# Patient Record
Sex: Female | Born: 1945 | Race: Black or African American | Hispanic: No | Smoking: Never smoker
Health system: Southern US, Community
[De-identification: ages and names within clinical notes are randomized; demographics above are authoritative.]

## PROBLEM LIST (undated history)

## (undated) DIAGNOSIS — I10 Essential (primary) hypertension: Secondary | ICD-10-CM

---

## 2004-04-07 ENCOUNTER — Ambulatory Visit: Payer: Self-pay | Admitting: Obstetrics & Gynecology

## 2005-06-23 ENCOUNTER — Ambulatory Visit: Payer: Self-pay | Admitting: Obstetrics & Gynecology

## 2005-06-29 ENCOUNTER — Ambulatory Visit: Payer: Self-pay | Admitting: Gastroenterology

## 2006-08-11 ENCOUNTER — Ambulatory Visit: Payer: Self-pay

## 2007-08-10 ENCOUNTER — Ambulatory Visit: Payer: Self-pay

## 2008-05-11 ENCOUNTER — Emergency Department: Payer: Self-pay | Admitting: Emergency Medicine

## 2008-11-21 ENCOUNTER — Ambulatory Visit: Payer: Self-pay

## 2010-01-21 ENCOUNTER — Ambulatory Visit: Payer: Self-pay

## 2011-02-27 ENCOUNTER — Ambulatory Visit: Payer: Self-pay | Admitting: Family Medicine

## 2012-04-06 ENCOUNTER — Ambulatory Visit: Payer: Self-pay | Admitting: Family Medicine

## 2013-04-11 ENCOUNTER — Ambulatory Visit: Payer: Self-pay | Admitting: Family Medicine

## 2014-04-13 ENCOUNTER — Ambulatory Visit: Admit: 2014-04-13 | Disposition: A | Discharge status: Home / Self Care | Payer: Self-pay | Admitting: Adult Health

## 2015-04-16 ENCOUNTER — Other Ambulatory Visit: Payer: Self-pay | Admitting: Family Medicine

## 2015-04-16 DIAGNOSIS — Z1231 Encounter for screening mammogram for malignant neoplasm of breast: Secondary | ICD-10-CM

## 2015-04-25 ENCOUNTER — Ambulatory Visit
Admission: RE | Admit: 2015-04-25 | Discharge: 2015-04-25 | Disposition: A | Discharge status: Home / Self Care | Payer: Medicare Other | Source: Ambulatory Visit | Attending: Family Medicine | Admitting: Family Medicine

## 2015-04-25 ENCOUNTER — Other Ambulatory Visit: Payer: Self-pay | Admitting: Family Medicine

## 2015-04-25 DIAGNOSIS — Z1231 Encounter for screening mammogram for malignant neoplasm of breast: Secondary | ICD-10-CM

## 2016-04-20 ENCOUNTER — Other Ambulatory Visit: Payer: Self-pay | Admitting: Family Medicine

## 2016-04-20 DIAGNOSIS — Z1231 Encounter for screening mammogram for malignant neoplasm of breast: Secondary | ICD-10-CM

## 2016-05-06 ENCOUNTER — Ambulatory Visit
Admission: RE | Admit: 2016-05-06 | Discharge: 2016-05-06 | Disposition: A | Discharge status: Home / Self Care | Payer: Medicare Other | Source: Ambulatory Visit | Attending: Family Medicine | Admitting: Family Medicine

## 2016-05-06 DIAGNOSIS — Z1231 Encounter for screening mammogram for malignant neoplasm of breast: Secondary | ICD-10-CM | POA: Diagnosis not present

## 2017-04-09 ENCOUNTER — Other Ambulatory Visit: Payer: Self-pay | Admitting: Family Medicine

## 2017-04-09 DIAGNOSIS — Z1231 Encounter for screening mammogram for malignant neoplasm of breast: Secondary | ICD-10-CM

## 2017-05-07 ENCOUNTER — Ambulatory Visit
Admission: RE | Admit: 2017-05-07 | Discharge: 2017-05-07 | Disposition: A | Discharge status: Home / Self Care | Payer: Medicare Other | Source: Ambulatory Visit | Attending: Family Medicine | Admitting: Family Medicine

## 2017-05-07 DIAGNOSIS — Z1231 Encounter for screening mammogram for malignant neoplasm of breast: Secondary | ICD-10-CM | POA: Diagnosis not present

## 2018-04-08 ENCOUNTER — Other Ambulatory Visit: Payer: Self-pay | Admitting: Family Medicine

## 2018-04-08 DIAGNOSIS — Z1231 Encounter for screening mammogram for malignant neoplasm of breast: Secondary | ICD-10-CM

## 2018-06-30 ENCOUNTER — Ambulatory Visit
Admission: RE | Admit: 2018-06-30 | Discharge: 2018-06-30 | Disposition: A | Discharge status: Home / Self Care | Payer: Medicare Other | Source: Ambulatory Visit | Attending: Family Medicine | Admitting: Family Medicine

## 2018-06-30 ENCOUNTER — Other Ambulatory Visit: Payer: Self-pay

## 2018-06-30 DIAGNOSIS — Z1231 Encounter for screening mammogram for malignant neoplasm of breast: Secondary | ICD-10-CM | POA: Insufficient documentation

## 2018-07-05 ENCOUNTER — Other Ambulatory Visit: Payer: Self-pay | Admitting: Family Medicine

## 2018-07-05 DIAGNOSIS — R928 Other abnormal and inconclusive findings on diagnostic imaging of breast: Secondary | ICD-10-CM

## 2018-07-12 ENCOUNTER — Ambulatory Visit
Admission: RE | Admit: 2018-07-12 | Discharge: 2018-07-12 | Disposition: A | Discharge status: Home / Self Care | Payer: Medicare Other | Source: Ambulatory Visit | Attending: Family Medicine | Admitting: Family Medicine

## 2018-07-12 ENCOUNTER — Other Ambulatory Visit: Payer: Self-pay

## 2018-07-12 DIAGNOSIS — R928 Other abnormal and inconclusive findings on diagnostic imaging of breast: Secondary | ICD-10-CM

## 2019-03-17 ENCOUNTER — Ambulatory Visit: Payer: Medicare Other | Attending: Internal Medicine

## 2019-03-17 DIAGNOSIS — Z23 Encounter for immunization: Secondary | ICD-10-CM

## 2019-03-17 NOTE — Progress Notes (Signed)
   Covid-19 Vaccination Clinic  Name:  Stacie Stein    MRN: [id number] DOB: Feb 27, 1945  03/17/2019  [name] was observed post Covid-19 immunization for 15 minutes without incident. She was provided with Vaccine Information Sheet and instruction to access the V-Safe system.   [name] was instructed to call 911 with any severe reactions post vaccine: Marland Kitchen Difficulty breathing  . Swelling of face and throat  . A fast heartbeat  . A bad rash all over body  . Dizziness and weakness   Immunizations Administered    Name Date Dose VIS Date Route   Pfizer COVID-19 Vaccine 03/17/2019 10:01 AM 0.3 mL 12/16/2018 Intramuscular   Manufacturer: [place]   Lot: [id number]   [place]: [id number]

## 2019-04-11 ENCOUNTER — Ambulatory Visit: Payer: Medicare Other | Attending: Internal Medicine

## 2019-04-11 DIAGNOSIS — Z23 Encounter for immunization: Secondary | ICD-10-CM

## 2019-04-11 NOTE — Progress Notes (Signed)
   Covid-19 Vaccination Clinic  Name:  Stacie Stein    MRN: [id number] DOB: 1945-02-17  04/11/2019  [name] was observed post Covid-19 immunization for 15 minutes without incident. She was provided with Vaccine Information Sheet and instruction to access the V-Safe system.   [name] was instructed to call 911 with any severe reactions post vaccine: Marland Kitchen Difficulty breathing  . Swelling of face and throat  . A fast heartbeat  . A bad rash all over body  . Dizziness and weakness   Immunizations Administered    Name Date Dose VIS Date Route   Pfizer COVID-19 Vaccine 04/11/2019  9:34 AM 0.3 mL 12/16/2018 Intramuscular   Manufacturer: [place]   Lot: [id number]   [place]: [id number]

## 2019-05-05 ENCOUNTER — Other Ambulatory Visit: Payer: Self-pay | Admitting: Family Medicine

## 2019-05-05 DIAGNOSIS — Z1231 Encounter for screening mammogram for malignant neoplasm of breast: Secondary | ICD-10-CM

## 2019-07-03 ENCOUNTER — Ambulatory Visit
Admission: RE | Admit: 2019-07-03 | Discharge: 2019-07-03 | Disposition: A | Discharge status: Home / Self Care | Payer: Medicare Other | Source: Ambulatory Visit | Attending: Family Medicine | Admitting: Family Medicine

## 2019-07-03 DIAGNOSIS — Z1231 Encounter for screening mammogram for malignant neoplasm of breast: Secondary | ICD-10-CM | POA: Diagnosis present

## 2019-07-09 ENCOUNTER — Encounter (HOSPITAL_BASED_OUTPATIENT_CLINIC_OR_DEPARTMENT_OTHER): Payer: Self-pay

## 2019-07-09 ENCOUNTER — Inpatient Hospital Stay
Admission: AD | Admit: 2019-07-09 | Payer: Medicare Other | Source: Other Acute Inpatient Hospital | Admitting: Family Medicine

## 2019-07-09 ENCOUNTER — Emergency Department (HOSPITAL_BASED_OUTPATIENT_CLINIC_OR_DEPARTMENT_OTHER): Payer: Medicare Other

## 2019-07-09 ENCOUNTER — Other Ambulatory Visit: Payer: Self-pay

## 2019-07-09 ENCOUNTER — Inpatient Hospital Stay (HOSPITAL_BASED_OUTPATIENT_CLINIC_OR_DEPARTMENT_OTHER)
Admission: EM | Admit: 2019-07-09 | Discharge: 2019-07-14 | DRG: 418 | Disposition: A | Discharge status: Home Health | Payer: Medicare Other | Attending: Internal Medicine | Admitting: Internal Medicine

## 2019-07-09 DIAGNOSIS — N179 Acute kidney failure, unspecified: Secondary | ICD-10-CM | POA: Diagnosis present

## 2019-07-09 DIAGNOSIS — I10 Essential (primary) hypertension: Secondary | ICD-10-CM | POA: Diagnosis present

## 2019-07-09 DIAGNOSIS — K859 Acute pancreatitis without necrosis or infection, unspecified: Secondary | ICD-10-CM

## 2019-07-09 DIAGNOSIS — K851 Biliary acute pancreatitis without necrosis or infection: Secondary | ICD-10-CM | POA: Diagnosis present

## 2019-07-09 DIAGNOSIS — E860 Dehydration: Secondary | ICD-10-CM | POA: Diagnosis present

## 2019-07-09 DIAGNOSIS — I129 Hypertensive chronic kidney disease with stage 1 through stage 4 chronic kidney disease, or unspecified chronic kidney disease: Secondary | ICD-10-CM | POA: Diagnosis present

## 2019-07-09 DIAGNOSIS — E876 Hypokalemia: Secondary | ICD-10-CM | POA: Diagnosis present

## 2019-07-09 DIAGNOSIS — K8020 Calculus of gallbladder without cholecystitis without obstruction: Secondary | ICD-10-CM

## 2019-07-09 DIAGNOSIS — R0600 Dyspnea, unspecified: Secondary | ICD-10-CM

## 2019-07-09 DIAGNOSIS — K8021 Calculus of gallbladder without cholecystitis with obstruction: Secondary | ICD-10-CM

## 2019-07-09 DIAGNOSIS — N1831 Chronic kidney disease, stage 3a: Secondary | ICD-10-CM | POA: Diagnosis present

## 2019-07-09 DIAGNOSIS — Z20822 Contact with and (suspected) exposure to covid-19: Secondary | ICD-10-CM | POA: Diagnosis present

## 2019-07-09 DIAGNOSIS — K8011 Calculus of gallbladder with chronic cholecystitis with obstruction: Secondary | ICD-10-CM | POA: Diagnosis present

## 2019-07-09 HISTORY — DX: Essential (primary) hypertension: I10

## 2019-07-09 LAB — URINALYSIS, ROUTINE W REFLEX MICROSCOPIC
Bilirubin Urine: NEGATIVE
Glucose, UA: NEGATIVE mg/dL
Hgb urine dipstick: NEGATIVE
Ketones, ur: NEGATIVE mg/dL
Nitrite: NEGATIVE
Protein, ur: NEGATIVE mg/dL
Specific Gravity, Urine: 1.015 (ref 1.005–1.030)
pH: 6.5 (ref 5.0–8.0)

## 2019-07-09 LAB — COMPREHENSIVE METABOLIC PANEL
ALT: 360 U/L — ABNORMAL HIGH (ref 0–44)
AST: 494 U/L — ABNORMAL HIGH (ref 15–41)
Albumin: 4.1 g/dL (ref 3.5–5.0)
Alkaline Phosphatase: 92 U/L (ref 38–126)
Anion gap: 12 (ref 5–15)
BUN: 22 mg/dL (ref 8–23)
CO2: 25 mmol/L (ref 22–32)
Calcium: 8.9 mg/dL (ref 8.9–10.3)
Chloride: 97 mmol/L — ABNORMAL LOW (ref 98–111)
Creatinine, Ser: 1.45 mg/dL — ABNORMAL HIGH (ref 0.44–1.00)
GFR calc Af Amer: 41 mL/min — ABNORMAL LOW (ref >60)
GFR calc non Af Amer: 36 mL/min — ABNORMAL LOW (ref >60)
Glucose, Bld: 187 mg/dL — ABNORMAL HIGH (ref 70–99)
Potassium: 3.1 mmol/L — ABNORMAL LOW (ref 3.5–5.1)
Sodium: 134 mmol/L — ABNORMAL LOW (ref 135–145)
Total Bilirubin: 1.4 mg/dL — ABNORMAL HIGH (ref 0.3–1.2)
Total Protein: 7.6 g/dL (ref 6.5–8.1)

## 2019-07-09 LAB — URINALYSIS, MICROSCOPIC (REFLEX)

## 2019-07-09 LAB — CBC WITH DIFFERENTIAL/PLATELET
Abs Immature Granulocytes: 0.06 10*3/uL (ref 0.00–0.07)
Basophils Absolute: 0 10*3/uL (ref 0.0–0.1)
Basophils Relative: 0 %
Eosinophils Absolute: 0.1 10*3/uL (ref 0.0–0.5)
Eosinophils Relative: 0 %
HCT: 39.1 % (ref 36.0–46.0)
Hemoglobin: 13.1 g/dL (ref 12.0–15.0)
Immature Granulocytes: 0 %
Lymphocytes Relative: 19 %
Lymphs Abs: 2.8 10*3/uL (ref 0.7–4.0)
MCH: 30.8 pg (ref 26.0–34.0)
MCHC: 33.5 g/dL (ref 30.0–36.0)
MCV: 92 fL (ref 80.0–100.0)
Monocytes Absolute: 0.7 10*3/uL (ref 0.1–1.0)
Monocytes Relative: 5 %
Neutro Abs: 11.2 10*3/uL — ABNORMAL HIGH (ref 1.7–7.7)
Neutrophils Relative %: 76 %
Platelets: 262 10*3/uL (ref 150–400)
RBC: 4.25 MIL/uL (ref 3.87–5.11)
RDW: 12.3 % (ref 11.5–15.5)
WBC: 14.8 10*3/uL — ABNORMAL HIGH (ref 4.0–10.5)
nRBC: 0 % (ref 0.0–0.2)

## 2019-07-09 LAB — SARS CORONAVIRUS 2 BY RT PCR (HOSPITAL ORDER, PERFORMED IN ~~LOC~~ HOSPITAL LAB): SARS Coronavirus 2: NEGATIVE

## 2019-07-09 LAB — LIPASE, BLOOD: Lipase: 7182 U/L — ABNORMAL HIGH (ref 11–51)

## 2019-07-09 MED ORDER — SODIUM CHLORIDE 0.9 % IV BOLUS
500.0000 mL | Freq: Once | INTRAVENOUS | Status: AC
  Administered 2019-07-09: 500 mL via INTRAVENOUS

## 2019-07-09 MED ORDER — FENTANYL CITRATE (PF) 100 MCG/2ML IJ SOLN
50.0000 ug | Freq: Once | INTRAMUSCULAR | Status: AC
  Administered 2019-07-09: 50 ug via INTRAVENOUS
  Filled 2019-07-09: qty 2

## 2019-07-09 MED ORDER — ONDANSETRON HCL 4 MG/2ML IJ SOLN
4.0000 mg | Freq: Once | INTRAMUSCULAR | Status: AC
  Administered 2019-07-09: 4 mg via INTRAVENOUS
  Filled 2019-07-09: qty 2

## 2019-07-09 MED ORDER — SODIUM CHLORIDE 0.9 % IV SOLN: INTRAVENOUS | Status: DC

## 2019-07-09 MED ORDER — IOHEXOL 300 MG/ML  SOLN
100.0000 mL | Freq: Once | INTRAMUSCULAR | Status: AC | PRN
  Administered 2019-07-09: 80 mL via INTRAVENOUS

## 2019-07-09 NOTE — ED Notes (Signed)
Pt to CT

## 2019-07-09 NOTE — ED Triage Notes (Addendum)
Pt presents with emesis and loose stool x 1 hour. Pt reports her abd is "sore" from throwing up.

## 2019-07-09 NOTE — ED Notes (Signed)
States that approx 1 hour ago began having N/V and D. Presents also with abd pain, states her stomach hurts due to the vomiting. ED PA at bedside

## 2019-07-09 NOTE — ED Notes (Signed)
PATIENT AND FAMILY INFORMED OF CURRENT ED VISITATION POLICY

## 2019-07-09 NOTE — ED Provider Notes (Signed)
[place] EMERGENCY DEPARTMENT Provider Note   CSN: [id number] Arrival date & time: 07/09/19  1906     History Chief Complaint  Patient presents with  . Emesis    Stacie Stein is a 74 y.o. female.  Patient with no past surgical history, history of hypertension presents to the emergency department for acute onset of nausea, vomiting, and diarrhea starting approximately 1 hour prior to arrival.  Patient began feeling nauseous while driving to her daughter's house.  When she arrived she had multiple episodes of vomiting.  She also had loose, nonbloody stool.  After vomiting, she developed a generalized soreness across her abdomen.  No urinary symptoms.  No treatments prior to arrival.  No known sick contacts or suspicious food exposures.  Patient denies history of any abdominal surgeries.  Pt denies EtOH use. No history of gallbladder disease. The onset of this condition was acute. The course is constant. Aggravating factors: none. Alleviating factors: none.          Past Medical History:  Diagnosis Date  . Hypertension     There are no problems to display for this patient.   History reviewed. No pertinent surgical history.   OB History   No obstetric history on file.     Family History  Problem Relation Age of Onset  . Breast cancer Neg Hx     Social History   Tobacco Use  . Smoking status: Never Smoker  . Smokeless tobacco: Never Used  Vaping Use  . Vaping Use: Never used  Substance Use Topics  . Alcohol use: Never  . Drug use: Never    Home Medications Prior to Admission medications   Not on File    Allergies    Patient has no known allergies.  Review of Systems   Review of Systems  Constitutional: Negative for appetite change and fever.  HENT: Negative for rhinorrhea and sore throat.   Eyes: Negative for redness.  Respiratory: Negative for cough.   Cardiovascular: Negative for chest pain.  Gastrointestinal: Positive for abdominal  pain, diarrhea, nausea and vomiting. Negative for blood in stool.       Negative for hematemesis  Genitourinary: Negative for dysuria.  Musculoskeletal: Negative for myalgias.  Skin: Negative for rash.  Neurological: Negative for light-headedness.    Physical Exam Updated Vital Signs BP (!) 124/53 (BP Location: Right Arm)   Pulse 94   Temp 98.6 F (37 C) (Oral)   Resp 20   SpO2 96%   Physical Exam Vitals and nursing note reviewed.  Constitutional:      Appearance: She is well-developed.  HENT:     Head: Normocephalic and atraumatic.  Eyes:     General:        Right eye: No discharge.        Left eye: No discharge.     Conjunctiva/sclera: Conjunctivae normal.  Cardiovascular:     Rate and Rhythm: Normal rate and regular rhythm.     Heart sounds: Normal heart sounds.  Pulmonary:     Effort: Pulmonary effort is normal.     Breath sounds: Normal breath sounds.  Abdominal:     Palpations: Abdomen is soft.     Tenderness: There is generalized abdominal tenderness (mild). There is no guarding or rebound.  Musculoskeletal:     Cervical back: Normal range of motion and neck supple.  Skin:    General: Skin is warm and dry.  Neurological:     Mental Status: She is  alert.     ED Results / Procedures / Treatments   Labs (all labs ordered are listed, but only abnormal results are displayed) Labs Reviewed  CBC WITH DIFFERENTIAL/PLATELET - Abnormal; Notable for the following components:      Result Value   WBC 14.8 (*)    Neutro Abs 11.2 (*)    All other components within normal limits  COMPREHENSIVE METABOLIC PANEL - Abnormal; Notable for the following components:   Sodium 134 (*)    Potassium 3.1 (*)    Chloride 97 (*)    Glucose, Bld 187 (*)    Creatinine, Ser 1.45 (*)    AST 494 (*)    ALT 360 (*)    Total Bilirubin 1.4 (*)    GFR calc non Af Amer 36 (*)    GFR calc Af Amer 41 (*)    All other components within normal limits  LIPASE, BLOOD - Abnormal; Notable  for the following components:   Lipase 7,182 (*)    All other components within normal limits  URINALYSIS, ROUTINE W REFLEX MICROSCOPIC - Abnormal; Notable for the following components:   Leukocytes,Ua SMALL (*)    All other components within normal limits  URINALYSIS, MICROSCOPIC (REFLEX) - Abnormal; Notable for the following components:   Bacteria, UA FEW (*)    All other components within normal limits  SARS CORONAVIRUS 2 BY RT PCR (HOSPITAL ORDER, [facility])    EKG None  Radiology CT ABDOMEN PELVIS W CONTRAST  Result Date: 07/09/2019 CLINICAL DATA:  74 year old female with pancreatitis. EXAM: CT ABDOMEN AND PELVIS WITH CONTRAST TECHNIQUE: Multidetector CT imaging of the abdomen and pelvis was performed using the standard protocol following bolus administration of intravenous contrast. CONTRAST:  75mL OMNIPAQUE IOHEXOL 300 MG/ML  SOLN COMPARISON:  None. FINDINGS: Lower chest: Bibasilar linear and streaky atelectasis. No intra-abdominal free air. Hepatobiliary: Probable mild fatty liver. No intrahepatic biliary duct dilatation. The gallbladder is distended there are several stones within the gallbladder. No pericholecystic fluid or evidence of acute cholecystitis by CT. There is a 5 mm stone in the neck of the gallbladder. Ultrasound may provide better evaluation of the gallbladder if clinically indicated. Pancreas: There is inflammatory changes of the pancreas with peripancreatic fluid tracking inferiorly along the mesentery and left paracolic gutter. No drainable fluid collection/abscess or pseudocyst. Spleen: Normal in size without focal abnormality. Adrenals/Urinary Tract: The adrenal glands are unremarkable. There is no hydronephrosis on either side. There is symmetric enhancement and excretion of contrast by both kidneys. Small bilateral renal cysts and additional subcentimeter hypodensities which are too small to characterize. The visualized ureters and urinary  bladder appear unremarkable. Stomach/Bowel: There is sigmoid diverticulosis without active inflammatory changes. There is no bowel obstruction or active inflammation. Normal appendix. Vascular/Lymphatic: Moderate aortoiliac atherosclerotic disease. The IVC is unremarkable. The SMV, splenic vein, and main portal vein are patent. No portal venous gas. There is no adenopathy. Reproductive: Myomatous uterus with small calcific foci. Bilateral tubal ligation clips. No adnexal masses. Other: None Musculoskeletal: Degenerative changes of the spine. No acute osseous pathology. IMPRESSION: 1. Acute pancreatitis. No abscess or pseudocyst. 2. Cholelithiasis. 3. Sigmoid diverticulosis. No bowel obstruction. 4. Aortic Atherosclerosis (ICD10-I70.0). Electronically Signed   By: Anner Crete M.D.   On: 07/09/2019 22:34    Procedures Procedures (including critical care time)  Medications Ordered in ED Medications  0.9 %  sodium chloride infusion ( Intravenous New Bag/Given 07/09/19 2313)  ondansetron (ZOFRAN) injection 4 mg (4 mg  Intravenous Given 07/09/19 1923)  sodium chloride 0.9 % bolus 500 mL ( Intravenous Stopped 07/09/19 2011)  sodium chloride 0.9 % bolus 500 mL (500 mLs Intravenous New Bag/Given 07/09/19 2230)  iohexol (OMNIPAQUE) 300 MG/ML solution 100 mL (80 mLs Intravenous Contrast Given 07/09/19 2205)  fentaNYL (SUBLIMAZE) injection 50 mcg (50 mcg Intravenous Given 07/09/19 2232)    ED Course  I have reviewed the triage vital signs and the nursing notes.  Pertinent labs & imaging results that were available during my care of the patient were reviewed by me and considered in my medical decision making (see chart for details).  Patient seen and examined. Work-up initiated. Medications ordered.   Vital signs reviewed and are as follows: BP (!) 124/53 (BP Location: Right Arm)   Pulse 94   Temp 98.6 F (37 C) (Oral)   Resp 20   SpO2 96%   10:00 PM Labs demonstrated elevated lipase and transaminases. Pt  and daughter updated. CT abd/pelvis ordered. Will need admission. Pt and daugther at bedside prefer [facility] as she is from [place], as request that patient be admitted there. Will inquire about transfer.   10:33 PM Spoke with Dr. Flossie Buffy who is admitting at [facility]. Accepts patient for transfer. I reviewed CT imaging -- consistent with pancreatitis, multiple gallstones noted. Awaiting final read.   11:15 PM after discussion with [facility] hospitalist at [facility], patient's daughter approached me stating that after discussion, the patient's family feels more comfortable with her being admitted to [facility].  Secretary called and spoke with [place]. Initially was told that there were no beds at [facility], however they found a bed for the patient and she is tentatively scheduled for transfer there.  Confirmed with [facility] that patient does not need a different accepting physician.   Patient and daughter updated.   Clinical Course as of Jul 08 2120  Nancy Fetter Jul 04, 532  1758 74 year old female here with acute onset of nausea vomiting diarrhea diffuse abdominal pain.  Abdomen is soft without any guarding or rebound.  Getting labs IV fluids nausea medication.   [MB]    Clinical Course User Index [MB] Hayden Rasmussen, MD   MDM Rules/Calculators/A&P                          Admit for acute pancreatitis and gallstones, concern for choledocholithiasis.   Final Clinical Impression(s) / ED Diagnoses Final diagnoses:  Acute pancreatitis, unspecified complication status, unspecified pancreatitis type  Calculus of gallbladder with biliary obstruction but without cholecystitis    Rx / DC Orders ED Discharge Orders    None       Carlisle Cater, PA-C 07/09/19 2318    Hayden Rasmussen, MD 07/10/19 1144

## 2019-07-09 NOTE — ED Notes (Signed)
Appears to be more comfortable now, no further c/o nausea, no vomiting noted at this time. Resting quietly with eyes closed

## 2019-07-10 ENCOUNTER — Encounter (HOSPITAL_COMMUNITY): Payer: Self-pay | Admitting: Family Medicine

## 2019-07-10 DIAGNOSIS — N179 Acute kidney failure, unspecified: Secondary | ICD-10-CM | POA: Diagnosis present

## 2019-07-10 DIAGNOSIS — E876 Hypokalemia: Secondary | ICD-10-CM | POA: Diagnosis present

## 2019-07-10 DIAGNOSIS — Z20822 Contact with and (suspected) exposure to covid-19: Secondary | ICD-10-CM | POA: Diagnosis present

## 2019-07-10 DIAGNOSIS — N1831 Chronic kidney disease, stage 3a: Secondary | ICD-10-CM | POA: Diagnosis present

## 2019-07-10 DIAGNOSIS — E860 Dehydration: Secondary | ICD-10-CM | POA: Diagnosis present

## 2019-07-10 DIAGNOSIS — K8011 Calculus of gallbladder with chronic cholecystitis with obstruction: Secondary | ICD-10-CM | POA: Diagnosis present

## 2019-07-10 DIAGNOSIS — I10 Essential (primary) hypertension: Secondary | ICD-10-CM | POA: Diagnosis not present

## 2019-07-10 DIAGNOSIS — K851 Biliary acute pancreatitis without necrosis or infection: Secondary | ICD-10-CM | POA: Diagnosis present

## 2019-07-10 DIAGNOSIS — K859 Acute pancreatitis without necrosis or infection, unspecified: Secondary | ICD-10-CM | POA: Diagnosis present

## 2019-07-10 DIAGNOSIS — I129 Hypertensive chronic kidney disease with stage 1 through stage 4 chronic kidney disease, or unspecified chronic kidney disease: Secondary | ICD-10-CM | POA: Diagnosis present

## 2019-07-10 DIAGNOSIS — K8021 Calculus of gallbladder without cholecystitis with obstruction: Secondary | ICD-10-CM | POA: Diagnosis not present

## 2019-07-10 LAB — COMPREHENSIVE METABOLIC PANEL
ALT: 373 U/L — ABNORMAL HIGH (ref 0–44)
AST: 287 U/L — ABNORMAL HIGH (ref 15–41)
Albumin: 3.8 g/dL (ref 3.5–5.0)
Alkaline Phosphatase: 85 U/L (ref 38–126)
Anion gap: 9 (ref 5–15)
BUN: 22 mg/dL (ref 8–23)
CO2: 23 mmol/L (ref 22–32)
Calcium: 8.2 mg/dL — ABNORMAL LOW (ref 8.9–10.3)
Chloride: 101 mmol/L (ref 98–111)
Creatinine, Ser: 1.18 mg/dL — ABNORMAL HIGH (ref 0.44–1.00)
GFR calc Af Amer: 53 mL/min — ABNORMAL LOW (ref >60)
GFR calc non Af Amer: 46 mL/min — ABNORMAL LOW (ref >60)
Glucose, Bld: 154 mg/dL — ABNORMAL HIGH (ref 70–99)
Potassium: 4 mmol/L (ref 3.5–5.1)
Sodium: 133 mmol/L — ABNORMAL LOW (ref 135–145)
Total Bilirubin: 1 mg/dL (ref 0.3–1.2)
Total Protein: 6.7 g/dL (ref 6.5–8.1)

## 2019-07-10 LAB — CBC WITH DIFFERENTIAL/PLATELET
Abs Immature Granulocytes: 0.04 10*3/uL (ref 0.00–0.07)
Basophils Absolute: 0 10*3/uL (ref 0.0–0.1)
Basophils Relative: 0 %
Eosinophils Absolute: 0 10*3/uL (ref 0.0–0.5)
Eosinophils Relative: 0 %
HCT: 37.4 % (ref 36.0–46.0)
Hemoglobin: 12.3 g/dL (ref 12.0–15.0)
Immature Granulocytes: 0 %
Lymphocytes Relative: 7 %
Lymphs Abs: 0.7 10*3/uL (ref 0.7–4.0)
MCH: 30.4 pg (ref 26.0–34.0)
MCHC: 32.9 g/dL (ref 30.0–36.0)
MCV: 92.3 fL (ref 80.0–100.0)
Monocytes Absolute: 0.4 10*3/uL (ref 0.1–1.0)
Monocytes Relative: 4 %
Neutro Abs: 8.9 10*3/uL — ABNORMAL HIGH (ref 1.7–7.7)
Neutrophils Relative %: 89 %
Platelets: 259 10*3/uL (ref 150–400)
RBC: 4.05 MIL/uL (ref 3.87–5.11)
RDW: 12.4 % (ref 11.5–15.5)
WBC: 10.1 10*3/uL (ref 4.0–10.5)
nRBC: 0 % (ref 0.0–0.2)

## 2019-07-10 LAB — LIPASE, BLOOD: Lipase: 1754 U/L — ABNORMAL HIGH (ref 11–51)

## 2019-07-10 LAB — TRIGLYCERIDES: Triglycerides: 40 mg/dL (ref <150)

## 2019-07-10 LAB — MAGNESIUM: Magnesium: 1.9 mg/dL (ref 1.7–2.4)

## 2019-07-10 MED ORDER — ONDANSETRON HCL 4 MG/2ML IJ SOLN
4.0000 mg | Freq: Four times a day (QID) | INTRAMUSCULAR | Status: AC
  Administered 2019-07-10 – 2019-07-13 (×11): 4 mg via INTRAVENOUS
  Filled 2019-07-10 (×11): qty 2

## 2019-07-10 MED ORDER — HYDROMORPHONE HCL 1 MG/ML IJ SOLN
1.0000 mg | INTRAMUSCULAR | Status: DC | PRN
  Administered 2019-07-11 – 2019-07-12 (×3): 1 mg via INTRAVENOUS
  Filled 2019-07-10 (×3): qty 1

## 2019-07-10 MED ORDER — LABETALOL HCL 5 MG/ML IV SOLN: 10.0000 mg | INTRAVENOUS | Status: DC | PRN

## 2019-07-10 MED ORDER — ONDANSETRON HCL 4 MG PO TABS: 4.0000 mg | ORAL_TABLET | Freq: Four times a day (QID) | ORAL | Status: DC | PRN

## 2019-07-10 MED ORDER — ACETAMINOPHEN 650 MG RE SUPP: 650.0000 mg | Freq: Four times a day (QID) | RECTAL | Status: DC | PRN

## 2019-07-10 MED ORDER — HYDRALAZINE HCL 20 MG/ML IJ SOLN
10.0000 mg | Freq: Four times a day (QID) | INTRAMUSCULAR | Status: DC | PRN
  Administered 2019-07-10 – 2019-07-12 (×4): 10 mg via INTRAVENOUS
  Filled 2019-07-10 (×4): qty 1

## 2019-07-10 MED ORDER — SODIUM CHLORIDE 0.9 % IV SOLN: INTRAVENOUS | Status: AC

## 2019-07-10 MED ORDER — POTASSIUM CHLORIDE 10 MEQ/100ML IV SOLN
10.0000 meq | INTRAVENOUS | Status: AC
  Administered 2019-07-10 (×3): 10 meq via INTRAVENOUS
  Filled 2019-07-10 (×3): qty 100

## 2019-07-10 MED ORDER — FENTANYL CITRATE (PF) 100 MCG/2ML IJ SOLN
12.5000 ug | INTRAMUSCULAR | Status: DC | PRN
  Administered 2019-07-10 (×4): 50 ug via INTRAVENOUS
  Filled 2019-07-10 (×4): qty 2

## 2019-07-10 MED ORDER — ACETAMINOPHEN 325 MG PO TABS
650.0000 mg | ORAL_TABLET | Freq: Four times a day (QID) | ORAL | Status: DC | PRN
  Administered 2019-07-11 – 2019-07-12 (×2): 650 mg via ORAL
  Filled 2019-07-10 (×2): qty 2

## 2019-07-10 MED ORDER — ONDANSETRON HCL 4 MG/2ML IJ SOLN
4.0000 mg | Freq: Four times a day (QID) | INTRAMUSCULAR | Status: DC | PRN
  Administered 2019-07-10: 4 mg via INTRAVENOUS
  Filled 2019-07-10 (×2): qty 2

## 2019-07-10 NOTE — Progress Notes (Signed)
°   07/10/19 1710  Vitals  Temp 100 F (37.8 C)  Temp Source Oral  BP (!) 171/67  MAP (mmHg) 88  BP Method Automatic  Pulse Rate (!) 116  Resp 20  Oxygen Therapy  SpO2 96 %  O2 Device Room Air   Dr. Pietro Cassis notified via text page.  Will continue to follow MEWS protocol.

## 2019-07-10 NOTE — Progress Notes (Signed)
   07/10/19 1500  Assess: MEWS Score  Temp 99.4 F (37.4 C)  BP (!) 173/86  Pulse Rate (!) 115  Resp 18  SpO2 95 %  Assess: MEWS Score  MEWS Temp 0  MEWS Systolic 0  MEWS Pulse 2  MEWS RR 0  MEWS LOC 0  MEWS Score 2  MEWS Score Color Yellow  Assess: if the MEWS score is Yellow or Red  Were vital signs taken at a resting state? Yes  Focused Assessment Documented focused assessment  Early Detection of Sepsis Score *See Row Information* Medium  MEWS guidelines implemented *See Row Information* Yes  Treat  MEWS Interventions Escalated (See documentation below)  Take Vital Signs  Increase Vital Sign Frequency  Yellow: Q 2hr X 2 then Q 4hr X 2, if remains yellow, continue Q 4hrs  Escalate  MEWS: Escalate Yellow: discuss with charge nurse/RN and consider discussing with provider and RRT  Notify: Charge Nurse/RN  Name of Charge Nurse/RN Notified Brien Mates, RN  Date Charge Nurse/RN Notified 07/10/19

## 2019-07-10 NOTE — H&P (Signed)
History and Physical    Stacie Stein [id number] DOB: [birth year] DOA: 07/09/2019  PCP: [place] Pink, MD   Patient coming from: Home   Chief Complaint: Abdominal pain, N/V/D   HPI: Stacie Stein is a 74 y.o. female with medical history significant for hypertension, now presenting to the emergency department with nausea, vomiting, loose stool, and abdominal discomfort.  Patient reports that she had been in her usual state of health and was having an uneventful day when she developed nausea, upper abdominal discomfort, and went on to have nonbloody vomiting and a loose stool.  She had never experienced this previously and denies any associated fevers, chills, cough, shortness of breath, chest pain, melena, or hematochezia.  The abdominal pain is described as an ache localized to the upper abdomen without any alleviating or exacerbating factors identified.  She denies any alcohol use.  [facility] ED Course: Upon arrival to the ED, patient is found to be afebrile, saturating mid 90s on room air, and with stable blood pressure.  Chemistry panel is notable for potassium of 3.1, creatinine 1.45, AST 494, and ALT 360.  CBC features a leukocytosis to 14,800.  Lipase was elevated to 7192.  CT abdomen/pelvis findings are concerning for acute pancreatitis without fluid collection and also notable for cholelithiasis.  COVID-19 screening test was negative.  Patient was given IV fluids, fentanyl, and Zofran in the ED and was transferred to [facility] for ongoing evaluation and management.  Review of Systems:  All other systems reviewed and apart from HPI, are negative.  Past Medical History:  Diagnosis Date  . Hypertension     History reviewed. No pertinent surgical history.   reports that she has never smoked. She has never used smokeless tobacco. She reports that she does not drink alcohol and does not use drugs.  No Known Allergies  Family History  Problem Relation  Age of Onset  . Breast cancer Neg Hx      Prior to Admission medications   Not on File    Physical Exam: Vitals:   07/09/19 2230 07/09/19 2330 07/10/19 0000 07/10/19 0006  BP: (!) 141/74 (!) 124/56 123/75   Pulse: 92 77 87 89  Resp:    15  Temp:    98.4 F (36.9 C)  TempSrc:    Oral  SpO2: 97% 93% 99% 98%    Constitutional: NAD, calm  Eyes: PERTLA, lids and conjunctivae normal ENMT: Mucous membranes are moist. Posterior pharynx clear of any exudate or lesions.   Neck: normal, supple, no masses, no thyromegaly Respiratory:  no wheezing, no crackles. No accessory muscle use.  Cardiovascular: S1 & S2 heard, regular rate and rhythm. Trace pedal edema bilaterally.    Abdomen: No distension, soft, tender in epigastrium without rebound pain or guarding. Bowel sounds active.  Musculoskeletal: no clubbing / cyanosis. No joint deformity upper and lower extremities.   Skin: no significant rashes, lesions, ulcers. Warm, dry, well-perfused. Neurologic: CN 2-12 grossly intact. Sensation intact. Moving all extremities.  Psychiatric: Alert and oriented to person, place, and situation. Calm and cooperative.    Labs and Imaging on Admission: I have personally reviewed following labs and imaging studies  CBC: Recent Labs  Lab 07/09/19 1922  WBC 14.8*  NEUTROABS 11.2*  HGB 13.1  HCT 39.1  MCV 92.0  PLT 027   Basic Metabolic Panel: Recent Labs  Lab 07/09/19 1922  NA 134*  K 3.1*  CL 97*  CO2 25  GLUCOSE 187*  BUN 22  CREATININE 1.45*  CALCIUM 8.9   GFR: CrCl cannot be calculated (Unknown ideal weight.). Liver Function Tests: Recent Labs  Lab 07/09/19 1922  AST 494*  ALT 360*  ALKPHOS 92  BILITOT 1.4*  PROT 7.6  ALBUMIN 4.1   Recent Labs  Lab 07/09/19 1922  LIPASE 7,182*   No results for input(s): AMMONIA in the last 168 hours. Coagulation Profile: No results for input(s): INR, PROTIME in the last 168 hours. Cardiac Enzymes: No results for input(s):  CKTOTAL, CKMB, CKMBINDEX, TROPONINI in the last 168 hours. BNP (last 3 results) No results for input(s): PROBNP in the last 8760 hours. HbA1C: No results for input(s): HGBA1C in the last 72 hours. CBG: No results for input(s): GLUCAP in the last 168 hours. Lipid Profile: No results for input(s): CHOL, HDL, LDLCALC, TRIG, CHOLHDL, LDLDIRECT in the last 72 hours. Thyroid Function Tests: No results for input(s): TSH, T4TOTAL, FREET4, T3FREE, THYROIDAB in the last 72 hours. Anemia Panel: No results for input(s): VITAMINB12, FOLATE, FERRITIN, TIBC, IRON, RETICCTPCT in the last 72 hours. Urine analysis:    Component Value Date/Time   COLORURINE YELLOW 07/09/2019 2050   APPEARANCEUR CLEAR 07/09/2019 2050   LABSPEC 1.015 07/09/2019 2050   PHURINE 6.5 07/09/2019 2050   GLUCOSEU NEGATIVE 07/09/2019 2050   [place] NEGATIVE 07/09/2019 2050   [place] NEGATIVE 07/09/2019 2050   [place] 07/09/2019 2050   PROTEINUR NEGATIVE 07/09/2019 2050   NITRITE NEGATIVE 07/09/2019 2050   LEUKOCYTESUR SMALL (A) 07/09/2019 2050   Sepsis Labs: @LABRCNTIP (procalcitonin:4,lacticidven:4) ) Recent Results (from the past 240 hour(s))  SARS Coronavirus 2 by RT PCR (hospital order, performed in [place] hospital lab) Nasopharyngeal Nasopharyngeal Swab     Status: None   Collection Time: 07/09/19  9:58 PM   Specimen: Nasopharyngeal Swab  Result Value Ref Range Status   SARS Coronavirus 2 NEGATIVE NEGATIVE Final    Comment: (NOTE) SARS-CoV-2 target nucleic acids are NOT DETECTED.  The SARS-CoV-2 RNA is generally detectable in upper and lower respiratory specimens during the acute phase of infection. The lowest concentration of SARS-CoV-2 viral copies this assay can detect is 250 copies / mL. A negative result does not preclude SARS-CoV-2 infection and should not be used as the sole basis for treatment or other patient management decisions.  A negative result may occur with improper specimen  collection / handling, submission of specimen other than nasopharyngeal swab, presence of viral mutation(s) within the areas targeted by this assay, and inadequate number of viral copies (<250 copies / mL). A negative result must be combined with clinical observations, patient history, and epidemiological information.  Fact Sheet for Patients:   StrictlyIdeas.no  Fact Sheet for Healthcare Providers: BankingDealers.co.za  This test is not yet approved or  cleared by the [place] FDA and has been authorized for detection and/or diagnosis of SARS-CoV-2 by FDA under an Emergency Use Authorization (EUA).  This EUA will remain in effect (meaning this test can be used) for the duration of the COVID-19 declaration under Section 564(b)(1) of the Act, 21 U.S.C. section 360bbb-3(b)(1), unless the authorization is terminated or revoked sooner.  Performed at [facility], [address] [place], [place] [address]      Radiological Exams on Admission: CT ABDOMEN PELVIS W CONTRAST  Result Date: 07/09/2019 CLINICAL DATA:  74 year old female with pancreatitis. EXAM: CT ABDOMEN AND PELVIS WITH CONTRAST TECHNIQUE: Multidetector CT imaging of the abdomen and pelvis was performed using the standard protocol following bolus administration  of intravenous contrast. CONTRAST:  39mL OMNIPAQUE IOHEXOL 300 MG/ML  SOLN COMPARISON:  None. FINDINGS: Lower chest: Bibasilar linear and streaky atelectasis. No intra-abdominal free air. Hepatobiliary: Probable mild fatty liver. No intrahepatic biliary duct dilatation. The gallbladder is distended there are several stones within the gallbladder. No pericholecystic fluid or evidence of acute cholecystitis by CT. There is a 5 mm stone in the neck of the gallbladder. Ultrasound may provide better evaluation of the gallbladder if clinically indicated. Pancreas: There is inflammatory changes of the pancreas with  peripancreatic fluid tracking inferiorly along the mesentery and left paracolic gutter. No drainable fluid collection/abscess or pseudocyst. Spleen: Normal in size without focal abnormality. Adrenals/Urinary Tract: The adrenal glands are unremarkable. There is no hydronephrosis on either side. There is symmetric enhancement and excretion of contrast by both kidneys. Small bilateral renal cysts and additional subcentimeter hypodensities which are too small to characterize. The visualized ureters and urinary bladder appear unremarkable. Stomach/Bowel: There is sigmoid diverticulosis without active inflammatory changes. There is no bowel obstruction or active inflammation. Normal appendix. Vascular/Lymphatic: Moderate aortoiliac atherosclerotic disease. The IVC is unremarkable. The SMV, splenic vein, and main portal vein are patent. No portal venous gas. There is no adenopathy. Reproductive: Myomatous uterus with small calcific foci. Bilateral tubal ligation clips. No adnexal masses. Other: None Musculoskeletal: Degenerative changes of the spine. No acute osseous pathology. IMPRESSION: 1. Acute pancreatitis. No abscess or pseudocyst. 2. Cholelithiasis. 3. Sigmoid diverticulosis. No bowel obstruction. 4. Aortic Atherosclerosis (ICD10-I70.0). Electronically Signed   By: Anner Crete M.D.   On: 07/09/2019 22:34    Assessment/Plan   1. Acute biliary pancreatitis  - Presents with upper abdominal pain and N/V and is found to have lipase of 7182, pancreatic inflammation and cholelithiasis on CT, and elevated transaminases (AST 494, ALT 360) - She was started on IVF and pain-control in ED  - Likely biliary pancreatitis, no fever or jaundice to suggest cholangitis  - Check MRCP, continue bowel-rest, continue IVF hydration, continue pain-control     2. Hypertension  - BP at goal, use IV labetalol as needed while NPO   3. CKD IIIa  - SCr is 1.45 on admission, up from a baseline of ~1.2  - Renally-dose  medications, continue IVF hydration, monitor    4. Hypokalemia  - Replacing IV for now, repeat chem panel in am    DVT prophylaxis: SCDs Code Status: Full  Family Communication: Discussed with patient  Disposition Plan:  Patient is from: Home  Anticipated d/c is to: TBD Anticipated d/c date is: 07/13/19 Patient currently: Pending improvement in acute pancreatitis, may need GI and/or surgery involvement  Consults called: none  Admission status: Inpatient     Vianne Bulls, MD [facility] Pager: See [email or web address]  If 7AM-7PM, please contact the daytime attending [email or web address]  07/10/2019, 1:26 AM

## 2019-07-10 NOTE — Progress Notes (Signed)
PROGRESS NOTE  Stacie Stein  DOB: 04-26-1945  PCP: Maryland Pink, MD IZT:[id number]  DOA: 07/09/2019  LOS: 0 days   Chief Complaint  Patient presents with  . Emesis   Brief narrative: Stacie Stein is a 74 y.o. female with PMH significant for hypertension.  She presented to the ED at [facility] on 7/4 with complaint of nausea, vomiting, loose stools and upper abdominal discomfort less than 24 hours duration.  No fever, blood in emesis, melena, hematochezia.  In the ED, hemodynamically stable. Chemistry panel is notable for potassium of 3.1, creatinine 1.45, AST 494, and ALT 360, alk phos normal at 92. WBC count elevated to 14,800.   Lipase was elevated to 7192.   CT abdomen/pelvis findings are concerning for acute pancreatitis without fluid collection and also notable for cholelithiasis.   Patient was started on conservative management, admitted to hospitalist service here at [facility].  Subjective: Patient was seen and examined this morning. Pleasant elderly African-American female.  Retching.  Threw up 1 time during my visit. Daughter and granddaughter at bedside. No history of pancreatitis in the past.  Chart reviewed. No fever, heart rate in 80s mostly, blood pressure 140s to 150s, breathing on room air. Repeat labs this morning with sodium 133, potassium 4, creatinine 1.18, lipase level significantly down to 1754, AST ALT 287/373, alk phos has remained stable throughout. WBC count down to normal at 10.1, hemoglobin 12.3.  Assessment/Plan: Acute biliary pancreatitis  -Presented with upper abdominal pain and N/V and is found to have lipase of 7182, pancreatic inflammation and cholelithiasis on CT, and elevated transaminases (AST 494, ALT 360) -Started on conservative management with IV fluid, pain control, antiemetics, bowel rest. -Likely biliary pancreatitis, no fever or jaundice to suggest cholangitis  -Pending MRCP, continue bowel-rest, continue IVF  hydration, continue pain-control. -Obtain general surgery consultation for potential need of cholecystectomy.  Hypertension  -On lisinopril/HCTZ 20 mg / 25 mg at home.  Currently on hold. -Hydralazine as needed.  CKD IIIa  -SCr is 1.45 on admission, up from a baseline of ~1.2. -Down to baseline this morning.  Continue IV fluid while n.p.o.  Hypokalemia  -Potassium 3.1 on admission.  Improved to 4 this morning with replacement.  Mobility: Encourage ambulation Code Status:   Code Status: Full Code  Nutritional status: There is no height or weight on file to calculate BMI.     Diet Order            Diet NPO time specified Except for: Ice Chips  Diet effective now                 DVT prophylaxis: SCDs Start: 07/10/19 0123   Antimicrobials:  None Fluid: Normal saline 100 mL/h  Consultants: None Family Communication:  Daughter and granddaughter at bedside.  Status is: Inpatient  Remains inpatient appropriate because:Ongoing active pain requiring inpatient pain management and IV treatments appropriate due to intensity of illness or inability to take PO   Dispo: The patient is from: Home              Anticipated d/c is to: Home              Anticipated d/c date is: 3 days              Patient currently is not medically stable to d/c.       Infusions:  . sodium chloride 110 mL/hr at 07/10/19 0500    Scheduled Meds:  Antimicrobials: Anti-infectives (From admission, onward)   None      PRN meds: acetaminophen **OR** acetaminophen, fentaNYL (SUBLIMAZE) injection, hydrALAZINE, ondansetron **OR** ondansetron (ZOFRAN) IV   Objective: Vitals:   07/10/19 0100 07/10/19 0600  BP: (!) 160/75 (!) 154/88  Pulse: 94 98  Resp: 18 18  Temp: 98.6 F (37 C) 98.7 F (37.1 C)  SpO2: 100% 95%    Intake/Output Summary (Last 24 hours) at 07/10/2019 0848 Last data filed at 07/10/2019 0500 Gross per 24 hour  Intake 1672.6 ml  Output --  Net 1672.6 ml   There were  no vitals filed for this visit. Weight change:  There is no height or weight on file to calculate BMI.   Physical Exam: General exam: In distress from pain and nausea, vomiting Skin: No rashes, lesions or ulcers. HEENT: Atraumatic, normocephalic, supple neck, no obvious bleeding Lungs: Clear to auscultation bilaterally CVS: Regular rate and rhythm, no murmur GI/Abd soft, nondistended, tenderness in the epigastrium present.  Bowel sound present CNS: Alert, awake monitor x3 Psychiatry: Sad affect  Extremities: No pedal edema, no calf tenderness  Data Review: I have personally reviewed the laboratory data and studies available.  Recent Labs  Lab 07/09/19 1922 07/10/19 0457  WBC 14.8* 10.1  NEUTROABS 11.2* 8.9*  HGB 13.1 12.3  HCT 39.1 37.4  MCV 92.0 92.3  PLT 262 259   Recent Labs  Lab 07/09/19 1922 07/10/19 0457  NA 134* 133*  K 3.1* 4.0  CL 97* 101  CO2 25 23  GLUCOSE 187* 154*  BUN 22 22  CREATININE 1.45* 1.18*  CALCIUM 8.9 8.2*  MG  --  1.9    Recent Labs  Lab 07/09/19 1922 07/10/19 0457  AST 494* 287*  ALT 360* 373*  ALKPHOS 92 85  BILITOT 1.4* 1.0  PROT 7.6 6.7  ALBUMIN 4.1 3.8   Recent Labs  Lab 07/09/19 1922 07/10/19 0457  LIPASE 7,182* 1,754*    Signed, Terrilee Croak, MD [facility] Pager: [phone] (Secure Chat preferred). 07/10/2019

## 2019-07-11 ENCOUNTER — Inpatient Hospital Stay (HOSPITAL_COMMUNITY): Payer: Medicare Other

## 2019-07-11 LAB — CBC WITH DIFFERENTIAL/PLATELET
Abs Immature Granulocytes: 0.1 10*3/uL — ABNORMAL HIGH (ref 0.00–0.07)
Basophils Absolute: 0 10*3/uL (ref 0.0–0.1)
Basophils Relative: 0 %
Eosinophils Absolute: 0 10*3/uL (ref 0.0–0.5)
Eosinophils Relative: 0 %
HCT: 36.8 % (ref 36.0–46.0)
Hemoglobin: 12.2 g/dL (ref 12.0–15.0)
Immature Granulocytes: 1 %
Lymphocytes Relative: 7 %
Lymphs Abs: 1 10*3/uL (ref 0.7–4.0)
MCH: 30.7 pg (ref 26.0–34.0)
MCHC: 33.2 g/dL (ref 30.0–36.0)
MCV: 92.7 fL (ref 80.0–100.0)
Monocytes Absolute: 1.2 10*3/uL — ABNORMAL HIGH (ref 0.1–1.0)
Monocytes Relative: 8 %
Neutro Abs: 12.7 10*3/uL — ABNORMAL HIGH (ref 1.7–7.7)
Neutrophils Relative %: 84 %
Platelets: 228 10*3/uL (ref 150–400)
RBC: 3.97 MIL/uL (ref 3.87–5.11)
RDW: 12.8 % (ref 11.5–15.5)
WBC: 14.9 10*3/uL — ABNORMAL HIGH (ref 4.0–10.5)
nRBC: 0 % (ref 0.0–0.2)

## 2019-07-11 LAB — COMPREHENSIVE METABOLIC PANEL
ALT: 210 U/L — ABNORMAL HIGH (ref 0–44)
AST: 82 U/L — ABNORMAL HIGH (ref 15–41)
Albumin: 3.5 g/dL (ref 3.5–5.0)
Alkaline Phosphatase: 74 U/L (ref 38–126)
Anion gap: 12 (ref 5–15)
BUN: 19 mg/dL (ref 8–23)
CO2: 21 mmol/L — ABNORMAL LOW (ref 22–32)
Calcium: 8.3 mg/dL — ABNORMAL LOW (ref 8.9–10.3)
Chloride: 104 mmol/L (ref 98–111)
Creatinine, Ser: 1.1 mg/dL — ABNORMAL HIGH (ref 0.44–1.00)
GFR calc Af Amer: 58 mL/min — ABNORMAL LOW (ref >60)
GFR calc non Af Amer: 50 mL/min — ABNORMAL LOW (ref >60)
Glucose, Bld: 143 mg/dL — ABNORMAL HIGH (ref 70–99)
Potassium: 3.3 mmol/L — ABNORMAL LOW (ref 3.5–5.1)
Sodium: 137 mmol/L (ref 135–145)
Total Bilirubin: 1 mg/dL (ref 0.3–1.2)
Total Protein: 6.7 g/dL (ref 6.5–8.1)

## 2019-07-11 LAB — LIPASE, BLOOD: Lipase: 337 U/L — ABNORMAL HIGH (ref 11–51)

## 2019-07-11 MED ORDER — POTASSIUM CHLORIDE 10 MEQ/100ML IV SOLN
10.0000 meq | INTRAVENOUS | Status: AC
  Administered 2019-07-11 (×3): 10 meq via INTRAVENOUS
  Filled 2019-07-11 (×3): qty 100

## 2019-07-11 MED ORDER — GADOBUTROL 1 MMOL/ML IV SOLN
10.0000 mL | Freq: Once | INTRAVENOUS | Status: AC | PRN
  Administered 2019-07-11: 10 mL via INTRAVENOUS

## 2019-07-11 MED ORDER — POTASSIUM CHLORIDE 2 MEQ/ML IV SOLN
INTRAVENOUS | Status: DC
  Filled 2019-07-11 (×5): qty 1000

## 2019-07-11 NOTE — Progress Notes (Signed)
PROGRESS NOTE  Stacie Stein  DOB: 06/28/1945  PCP: Hedrick, James, MD MRN:[id number]  DOA: 07/09/2019  LOS: 1 day   Chief Complaint  Patient presents with  . Emesis   Brief narrative: Stacie Stein is a 73 y.o. female with PMH significant for hypertension.  She presented to the ED at [facility] on 7/4 with complaint of nausea, vomiting, loose stools and upper abdominal discomfort less than 24 hours duration.  No fever, blood in emesis, melena, hematochezia.  In the ED, hemodynamically stable. Chemistry panel is notable for potassium of 3.1, creatinine 1.45, AST 494, and ALT 360, alk phos normal at 92. WBC count elevated to 14,800.   Lipase was elevated to 7192.   CT abdomen/pelvis findings are concerning for acute pancreatitis without fluid collection and also notable for cholelithiasis.   Patient was started on conservative management, admitted to hospitalist service here at [place].  Subjective: Patient was seen and examined this morning. Feels better today.  Less pain.  Less nausea no vomiting since yesterday morning.  Taking ice chips. Daughter at bedside.  Chart reviewed. T-max of 100.  Remains tachycardic mostly between 100 and 120.  Blood pressure elevated up to 170s. Labs from this morning with potassium low at 3.3, creatinine improving at 1.1, lipase level significantly improved with creatinine 37.  AST ALT improving as well.  Assessment/Plan: Acute biliary pancreatitis  Transaminitis -Presented with upper abdominal pain and N/V and is found to have lipase of 7182, pancreatic inflammation and cholelithiasis on CT, and elevated transaminases (AST 494, ALT 360). -Suspect patient had gallstone pancreatitis and the stone might have already passed by the time of her presentation. -Currently on conservative management with IV fluid, pain control, antiemetics, bowel rest. -Clinically improving with less abdominal pain, less nausea.  Improving lipase level  and liver enzymes level. -Pending MRCP this morning. -Pending general surgery consultation.  Accelerated hypertension  -Blood pressure running high up to 170s.   -Prior to admission, patient was on lisinopril/HCTZ 20 mg / 25 mg at home.  Currently on hold. -IV hydralazine as needed.   Sinus tachycardia -Likely secondary to pancreatitis, pain, low potassium.  Was not on beta-blocker at home. -Continue to monitor.  CKD IIIa  -SCr is 1.45 on admission, up from a baseline of ~1.2. -Improved down to normal with hydration.  Hypokalemia  -Potassium 3.3 this morning.  IV replacement ordered.  Mobility: Encourage ambulation Code Status:   Code Status: Full Code  Nutritional status: There is no height or weight on file to calculate BMI.     Diet Order            Diet NPO time specified Except for: Ice Chips  Diet effective now                 DVT prophylaxis: SCDs Start: 07/10/19 0123   Antimicrobials:  None Fluid: Normal saline 100 mL/h to continue  Consultants: None Family Communication:  Daughter and granddaughter at bedside.  Status is: Inpatient  Remains inpatient appropriate because:Ongoing active pain requiring inpatient pain management and IV treatments appropriate due to intensity of illness or inability to take PO   Dispo: The patient is from: Home              Anticipated d/c is to: Home              Anticipated d/c date is: 3 days                Patient currently is not medically stable to d/c.  Infusions:  . potassium chloride 10 mEq (07/11/19 1022)    Scheduled Meds: . ondansetron (ZOFRAN) IV  4 mg Intravenous Q6H    Antimicrobials: Anti-infectives (From admission, onward)   None      PRN meds: acetaminophen **OR** acetaminophen, hydrALAZINE, HYDROmorphone (DILAUDID) injection, ondansetron **OR** ondansetron (ZOFRAN) IV   Objective: Vitals:   07/11/19 0524 07/11/19 0843  BP: (!) 178/80 (!) 159/72  Pulse: (!) 115 (!) 121  Resp: 20     Temp: 98 F (36.7 C)   SpO2: 95%     Intake/Output Summary (Last 24 hours) at 07/11/2019 1138 Last data filed at 07/11/2019 0809 Gross per 24 hour  Intake 1023.87 ml  Output --  Net 1023.87 ml   There were no vitals filed for this visit. Weight change:  There is no height or weight on file to calculate BMI.   Physical Exam: General exam: Not in physical distress today Skin: No rashes, lesions or ulcers. HEENT: Atraumatic, normocephalic, supple neck, no obvious bleeding Lungs: Clear to auscultation bilaterally, no crackles or wheezing. CVS: Regular rate and rhythm, no murmur GI/Abd soft, nondistended, less tenderness today.  Bowel sound present. CNS: Alert, awake monitor x3 Psychiatry: Mood appropriate Extremities: No pedal edema, no calf tenderness  Data Review: I have personally reviewed the laboratory data and studies available.  Recent Labs  Lab 07/09/19 1922 07/10/19 0457 07/11/19 0410  WBC 14.8* 10.1 14.9*  NEUTROABS 11.2* 8.9* 12.7*  HGB 13.1 12.3 12.2  HCT 39.1 37.4 36.8  MCV 92.0 92.3 92.7  PLT 262 259 228   Recent Labs  Lab 07/09/19 1922 07/10/19 0457 07/11/19 0410  NA 134* 133* 137  K 3.1* 4.0 3.3*  CL 97* 101 104  CO2 25 23 21*  GLUCOSE 187* 154* 143*  BUN 22 22 19  CREATININE 1.45* 1.18* 1.10*  CALCIUM 8.9 8.2* 8.3*  MG  --  1.9  --     Recent Labs  Lab 07/09/19 1922 07/10/19 0457 07/11/19 0410  AST 494* 287* 82*  ALT 360* 373* 210*  ALKPHOS 92 85 74  BILITOT 1.4* 1.0 1.0  PROT 7.6 6.7 6.7  ALBUMIN 4.1 3.8 3.5   Recent Labs  Lab 07/09/19 1922 07/10/19 0457 07/11/19 0410  LIPASE 7,182* 1,754* 337*    Signed, Binaya Dahal, MD [facility] Pager: [phone] (Secure Chat preferred). 07/11/2019            

## 2019-07-11 NOTE — Consult Note (Signed)
[facility] Surgery Consult Note  Grazia Taffe [birth year]  [id number].    Requesting MD: Terrilee Croak Chief Complaint: Abdominal pain, nausea, vomiting and loose stools Reason for Consult: Pancreatitis/cholelithiasis  HPI:  Patient is a 74 year old female presented to ED at [facility] 07/09/2019 with abdominal pain, nausea vomiting and loose stools.  She says she was fine till Sunday.  She has never had anything like this and was not aware of having gallstones.  Work-up in the ED shows she is afebrile slightly tachycardic with an elevated blood pressure.  CMP shows a sodium of 134, potassium of 3.1, glucose of 187, creatinine of 1.45.  Lipase 7182, AST 494, ALT 360, total bilirubin 1.4.  WBC 14.8, hemoglobin 13.1, hematocrit 39, platelets 262,000.  CT of the abdomen 07/09/2019; shows a fatty liver, no intrahepatic or biliary ductal dilatation.  Gallbladder is distended there are several stones in the gallbladder.  No pericholecystic fluid or evidence of acute cholecystitis by CT.  There is a 5 mm stone in the neck of the gallbladder.  There is inflammatory change of the pancreas with peripancreatic fluid tracking inferiorly along the mesentery and the left paracolic gutter.  No drainable fluid abscesses or collections or pseudocyst was noted.  Findings were consistent with acute pancreatitis no abscess or pseudocyst.,  Cholelithiasis, sigmoid diverticulosis, and atherosclerotic aortic disease.  We are asked to see.  Labs today shows a white count of 14.9, hemoglobin 12, hematocrit 36.8, platelets 228,000.  Total bilirubin 1.0, AST 82 ,ALT 210, lipase 337, MRI obtained today shows extensive inflammatory fat stranding and fluid about the pancreas adjacent to the retroperitoneum consistent with acute pancreatitis.  No discrete fluid collections were noted.  No evidence of parenchymal necrosis.  There is a distended gallbladder containing numerous gallstones no biliary ductal dilatation no  evidence of gallstones, or other lesions within the common bile duct.  There is a trace of hepatic and perisplenic ascites.  Small bilateral effusions associated with atelectasis or consolidation.  Past medical history significant for hypertension, and stage IIIa chronic kidney disease.  ROS: Review of Systems  Constitutional: Positive for fever (low grade x 1 in hospital).  HENT: Negative.   Eyes: Negative.   Respiratory: Negative.        She snores and may have sleep apnea per family.  She had COVID in Dec 2020  Cardiovascular: Negative.   Gastrointestinal: Positive for abdominal pain, nausea and vomiting. Negative for blood in stool, constipation, diarrhea, heartburn and melena.  Genitourinary: Negative.   Musculoskeletal: Negative.   Skin: Negative.   Neurological: Negative.   Endo/Heme/Allergies: Does not bruise/bleed easily.  Psychiatric/Behavioral: Negative.     Family History  Problem Relation Age of Onset   Breast cancer Neg Hx     Past Medical History:  Diagnosis Date   Hypertension     History reviewed. No pertinent surgical history.  Social History:  reports that she has never smoked. She has never used smokeless tobacco. She reports that she does not drink alcohol and does not use drugs.  Allergies: No Known Allergies  Medications Prior to Admission  Medication Sig Dispense Refill   Cholecalciferol (VITAMIN D3) 25 MCG (1000 UT) CAPS Take 1,000 Units by mouth daily after breakfast.     lisinopril-hydrochlorothiazide (ZESTORETIC) 20-25 MG tablet Take 1 tablet by mouth daily after breakfast.       Blood pressure (!) 159/72, pulse (!) 121, temperature 98 F (36.7 C), temperature source Oral, resp. rate 20, SpO2 95 %.  Physical Exam:  General: pleasant, WD, AA female who is laying in bed in NAD HEENT: head is normocephalic, atraumatic.  Sclera are noninjected.  PERRL.  Ears and nose without any masses or lesions.  Mouth is pink and moist Heart: regular,  rate, and rhythm.  Normal s1,s2. No obvious murmurs, gallops, or rubs noted.  Palpable radial and pedal pulses bilaterally Lungs: CTAB, no wheezes, rhonchi, or rales noted.  Respiratory effort nonlabored Abd: soft, sore mid abd/RUQ area, ND, +BS, no masses, hernias, or organomegaly MS: all 4 extremities are symmetrical with no cyanosis, clubbing, or edema. Skin: warm and dry with no masses, lesions, or rashes Neuro: Cranial nerves 2-12 grossly intact, sensation is normal throughout Psych: A&Ox3 with an appropriate affect.   Results for orders placed or performed during the hospital encounter of 07/09/19 (from the past 48 hour(s))  CBC with Differential/Platelet     Status: Abnormal   Collection Time: 07/09/19  7:22 PM  Result Value Ref Range   WBC 14.8 (H) 4.0 - 10.5 K/uL   RBC 4.25 3.87 - 5.11 MIL/uL   Hemoglobin 13.1 12.0 - 15.0 g/dL   HCT 39.1 36 - 46 %   MCV 92.0 80.0 - 100.0 fL   MCH 30.8 26.0 - 34.0 pg   MCHC 33.5 30.0 - 36.0 g/dL   RDW 12.3 11.5 - 15.5 %   Platelets 262 150 - 400 K/uL   nRBC 0.0 0.0 - 0.2 %   Neutrophils Relative % 76 %   Neutro Abs 11.2 (H) 1.7 - 7.7 K/uL   Lymphocytes Relative 19 %   Lymphs Abs 2.8 0.7 - 4.0 K/uL   Monocytes Relative 5 %   Monocytes Absolute 0.7 0 - 1 K/uL   Eosinophils Relative 0 %   Eosinophils Absolute 0.1 0 - 0 K/uL   Basophils Relative 0 %   Basophils Absolute 0.0 0 - 0 K/uL   Immature Granulocytes 0 %   Abs Immature Granulocytes 0.06 0.00 - 0.07 K/uL    Comment: Performed at [facility], [address] [place], [place] [address]  Comprehensive metabolic panel     Status: Abnormal   Collection Time: 07/09/19  7:22 PM  Result Value Ref Range   Sodium 134 (L) 135 - 145 mmol/L   Potassium 3.1 (L) 3.5 - 5.1 mmol/L   Chloride 97 (L) 98 - 111 mmol/L   CO2 25 22 - 32 mmol/L   Glucose, Bld 187 (H) 70 - 99 mg/dL    Comment: Glucose reference range applies only to samples taken after fasting for at least 8 hours.   BUN 22  8 - 23 mg/dL   Creatinine, Ser 1.45 (H) 0.44 - 1.00 mg/dL   Calcium 8.9 8.9 - 10.3 mg/dL   Total Protein 7.6 6.5 - 8.1 g/dL   Albumin 4.1 3.5 - 5.0 g/dL   AST 494 (H) 15 - 41 U/L   ALT 360 (H) 0 - 44 U/L   Alkaline Phosphatase 92 38 - 126 U/L   Total Bilirubin 1.4 (H) 0.3 - 1.2 mg/dL   GFR calc non Af Amer 36 (L) >60 mL/min   GFR calc Af Amer 41 (L) >60 mL/min   Anion gap 12 5 - 15    Comment: Performed at [facility], [address] [place], [place] [address]  Lipase, blood     Status: Abnormal   Collection Time: 07/09/19  7:22 PM  Result Value Ref Range  Lipase 7,182 (H) 11 - 51 U/L    Comment: RESULTS CONFIRMED BY MANUAL DILUTION Performed at [facility], [address] [place], [place] [address]   Urinalysis, Routine w reflex microscopic     Status: Abnormal   Collection Time: 07/09/19  8:50 PM  Result Value Ref Range   Color, Urine YELLOW YELLOW   APPearance CLEAR CLEAR   Specific Gravity, Urine 1.015 1.005 - 1.030   pH 6.5 5.0 - 8.0   Glucose, UA NEGATIVE NEGATIVE mg/dL   Hgb urine dipstick NEGATIVE NEGATIVE   Bilirubin Urine NEGATIVE NEGATIVE   Ketones, ur NEGATIVE NEGATIVE mg/dL   Protein, ur NEGATIVE NEGATIVE mg/dL   Nitrite NEGATIVE NEGATIVE   Leukocytes,Ua SMALL (A) NEGATIVE    Comment: Performed at [facility], [address] [place], [place] [address]  Urinalysis, Microscopic (reflex)     Status: Abnormal   Collection Time: 07/09/19  8:50 PM  Result Value Ref Range   RBC / HPF 0-5 0 - 5 RBC/hpf   WBC, UA 0-5 0 - 5 WBC/hpf   Bacteria, UA FEW (A) NONE SEEN   Squamous Epithelial / LPF 6-10 0 - 5   Hyaline Casts, UA PRESENT    Granular Casts, UA PRESENT     Comment: Performed at [facility], [address] [place], [place] [address]  SARS Coronavirus 2 by RT PCR (hospital order, performed in [facility] hospital lab) Nasopharyngeal Nasopharyngeal Swab     Status: None   Collection Time: 07/09/19  9:58 PM    Specimen: Nasopharyngeal Swab  Result Value Ref Range   SARS Coronavirus 2 NEGATIVE NEGATIVE    Comment: (NOTE) SARS-CoV-2 target nucleic acids are NOT DETECTED.  The SARS-CoV-2 RNA is generally detectable in upper and lower respiratory specimens during the acute phase of infection. The lowest concentration of SARS-CoV-2 viral copies this assay can detect is 250 copies / mL. A negative result does not preclude SARS-CoV-2 infection and should not be used as the sole basis for treatment or other patient management decisions.  A negative result may occur with improper specimen collection / handling, submission of specimen other than nasopharyngeal swab, presence of viral mutation(s) within the areas targeted by this assay, and inadequate number of viral copies (<250 copies / mL). A negative result must be combined with clinical observations, patient history, and epidemiological information.  Fact Sheet for Patients:   StrictlyIdeas.no  Fact Sheet for Healthcare Providers: BankingDealers.co.za  This test is not yet approved or  cleared by the [place] FDA and has been authorized for detection and/or diagnosis of SARS-CoV-2 by FDA under an Emergency Use Authorization (EUA).  This EUA will remain in effect (meaning this test can be used) for the duration of the COVID-19 declaration under Section 564(b)(1) of the Act, 21 U.S.C. section 360bbb-3(b)(1), unless the authorization is terminated or revoked sooner.  Performed at [facility], [address] [place], [place] [address]   Comprehensive metabolic panel     Status: Abnormal   Collection Time: 07/10/19  4:57 AM  Result Value Ref Range   Sodium 133 (L) 135 - 145 mmol/L   Potassium 4.0 3.5 - 5.1 mmol/L   Chloride 101 98 - 111 mmol/L   CO2 23 22 - 32 mmol/L   Glucose, Bld 154 (H) 70 - 99 mg/dL    Comment: Glucose reference range applies only to samples taken after  fasting for at least 8  hours.   BUN 22 8 - 23 mg/dL   Creatinine, Ser 1.18 (H) 0.44 - 1.00 mg/dL   Calcium 8.2 (L) 8.9 - 10.3 mg/dL   Total Protein 6.7 6.5 - 8.1 g/dL   Albumin 3.8 3.5 - 5.0 g/dL   AST 287 (H) 15 - 41 U/L   ALT 373 (H) 0 - 44 U/L   Alkaline Phosphatase 85 38 - 126 U/L   Total Bilirubin 1.0 0.3 - 1.2 mg/dL   GFR calc non Af Amer 46 (L) >60 mL/min   GFR calc Af Amer 53 (L) >60 mL/min   Anion gap 9 5 - 15    Comment: Performed at [facility], [place] [address], [place], [place] [address]  CBC WITH DIFFERENTIAL     Status: Abnormal   Collection Time: 07/10/19  4:57 AM  Result Value Ref Range   WBC 10.1 4.0 - 10.5 K/uL   RBC 4.05 3.87 - 5.11 MIL/uL   Hemoglobin 12.3 12.0 - 15.0 g/dL   HCT 37.4 36 - 46 %   MCV 92.3 80.0 - 100.0 fL   MCH 30.4 26.0 - 34.0 pg   MCHC 32.9 30.0 - 36.0 g/dL   RDW 12.4 11.5 - 15.5 %   Platelets 259 150 - 400 K/uL   nRBC 0.0 0.0 - 0.2 %   Neutrophils Relative % 89 %   Neutro Abs 8.9 (H) 1.7 - 7.7 K/uL   Lymphocytes Relative 7 %   Lymphs Abs 0.7 0.7 - 4.0 K/uL   Monocytes Relative 4 %   Monocytes Absolute 0.4 0 - 1 K/uL   Eosinophils Relative 0 %   Eosinophils Absolute 0.0 0 - 0 K/uL   Basophils Relative 0 %   Basophils Absolute 0.0 0 - 0 K/uL   Immature Granulocytes 0 %   Abs Immature Granulocytes 0.04 0.00 - 0.07 K/uL    Comment: Performed at [facility], [place] [address], [place], [place] [address]  Lipase, blood     Status: Abnormal   Collection Time: 07/10/19  4:57 AM  Result Value Ref Range   Lipase 1,754 (H) 11 - 51 U/L    Comment: RESULTS CONFIRMED BY MANUAL DILUTION Performed at [facility], [place] [address] [place], [place] [address]   Triglycerides     Status: None   Collection Time: 07/10/19  4:57 AM  Result Value Ref Range   Triglycerides 40 <150 mg/dL    Comment: Performed at [facility], [place] [address] [place], [place] [address]  Magnesium      Status: None   Collection Time: 07/10/19  4:57 AM  Result Value Ref Range   Magnesium 1.9 1.7 - 2.4 mg/dL    Comment: Performed at [facility], [place] [address] [place], [place] [address]  Comprehensive metabolic panel     Status: Abnormal   Collection Time: 07/11/19  4:10 AM  Result Value Ref Range   Sodium 137 135 - 145 mmol/L   Potassium 3.3 (L) 3.5 - 5.1 mmol/L    Comment: DELTA CHECK NOTED   Chloride 104 98 - 111 mmol/L   CO2 21 (L) 22 - 32 mmol/L   Glucose, Bld 143 (H) 70 - 99 mg/dL    Comment: Glucose reference range applies only to samples taken after fasting for at least 8 hours.   BUN 19 8 - 23 mg/dL   Creatinine, Ser 1.10 (H) 0.44 - 1.00 mg/dL   Calcium 8.3 (  L) 8.9 - 10.3 mg/dL   Total Protein 6.7 6.5 - 8.1 g/dL   Albumin 3.5 3.5 - 5.0 g/dL   AST 82 (H) 15 - 41 U/L   ALT 210 (H) 0 - 44 U/L   Alkaline Phosphatase 74 38 - 126 U/L   Total Bilirubin 1.0 0.3 - 1.2 mg/dL   GFR calc non Af Amer 50 (L) >60 mL/min   GFR calc Af Amer 58 (L) >60 mL/min   Anion gap 12 5 - 15    Comment: Performed at [facility], [place] [address] [place], [place] [address]  CBC WITH DIFFERENTIAL     Status: Abnormal   Collection Time: 07/11/19  4:10 AM  Result Value Ref Range   WBC 14.9 (H) 4.0 - 10.5 K/uL   RBC 3.97 3.87 - 5.11 MIL/uL   Hemoglobin 12.2 12.0 - 15.0 g/dL   HCT 36.8 36 - 46 %   MCV 92.7 80.0 - 100.0 fL   MCH 30.7 26.0 - 34.0 pg   MCHC 33.2 30.0 - 36.0 g/dL   RDW 12.8 11.5 - 15.5 %   Platelets 228 150 - 400 K/uL   nRBC 0.0 0.0 - 0.2 %   Neutrophils Relative % 84 %   Neutro Abs 12.7 (H) 1.7 - 7.7 K/uL   Lymphocytes Relative 7 %   Lymphs Abs 1.0 0.7 - 4.0 K/uL   Monocytes Relative 8 %   Monocytes Absolute 1.2 (H) 0 - 1 K/uL   Eosinophils Relative 0 %   Eosinophils Absolute 0.0 0 - 0 K/uL   Basophils Relative 0 %   Basophils Absolute 0.0 0 - 0 K/uL   Immature Granulocytes 1 %   Abs Immature Granulocytes 0.10 (H) 0.00 - 0.07 K/uL     Comment: Performed at [facility], [place] [address], [place], [place] [address]  Lipase, blood     Status: Abnormal   Collection Time: 07/11/19  4:10 AM  Result Value Ref Range   Lipase 337 (H) 11 - 51 U/L    Comment: Performed at [facility], [place] [address] [place], [place] [address]   CT ABDOMEN PELVIS W CONTRAST  Result Date: 07/09/2019 CLINICAL DATA:  74 year old female with pancreatitis. EXAM: CT ABDOMEN AND PELVIS WITH CONTRAST TECHNIQUE: Multidetector CT imaging of the abdomen and pelvis was performed using the standard protocol following bolus administration of intravenous contrast. CONTRAST:  78mL OMNIPAQUE IOHEXOL 300 MG/ML  SOLN COMPARISON:  None. FINDINGS: Lower chest: Bibasilar linear and streaky atelectasis. No intra-abdominal free air. Hepatobiliary: Probable mild fatty liver. No intrahepatic biliary duct dilatation. The gallbladder is distended there are several stones within the gallbladder. No pericholecystic fluid or evidence of acute cholecystitis by CT. There is a 5 mm stone in the neck of the gallbladder. Ultrasound may provide better evaluation of the gallbladder if clinically indicated. Pancreas: There is inflammatory changes of the pancreas with peripancreatic fluid tracking inferiorly along the mesentery and left paracolic gutter. No drainable fluid collection/abscess or pseudocyst. Spleen: Normal in size without focal abnormality. Adrenals/Urinary Tract: The adrenal glands are unremarkable. There is no hydronephrosis on either side. There is symmetric enhancement and excretion of contrast by both kidneys. Small bilateral renal cysts and additional subcentimeter hypodensities which are too small to characterize. The visualized ureters and urinary bladder appear unremarkable. Stomach/Bowel: There is sigmoid diverticulosis without active inflammatory changes. There is no bowel obstruction or active inflammation. Normal appendix.  Vascular/Lymphatic: Moderate aortoiliac atherosclerotic disease. The IVC  is unremarkable. The SMV, splenic vein, and main portal vein are patent. No portal venous gas. There is no adenopathy. Reproductive: Myomatous uterus with small calcific foci. Bilateral tubal ligation clips. No adnexal masses. Other: None Musculoskeletal: Degenerative changes of the spine. No acute osseous pathology. IMPRESSION: 1. Acute pancreatitis. No abscess or pseudocyst. 2. Cholelithiasis. 3. Sigmoid diverticulosis. No bowel obstruction. 4. Aortic Atherosclerosis (ICD10-I70.0). Electronically Signed   By: Anner Crete M.D.   On: 07/09/2019 22:34   MR 3D Recon At Scanner  Result Date: 07/11/2019 CLINICAL DATA:  Pancreatitis, cholelithiasis EXAM: MRI ABDOMEN WITHOUT AND WITH CONTRAST (INCLUDING MRCP) TECHNIQUE: Multiplanar multisequence MR imaging of the abdomen was performed both before and after the administration of intravenous contrast. Heavily T2-weighted images of the biliary and pancreatic ducts were obtained, and three-dimensional MRCP images were rendered by post processing. CONTRAST:  60mL GADAVIST GADOBUTROL 1 MMOL/ML IV SOLN COMPARISON:  CT abdomen pelvis, 07/09/2019 FINDINGS: Lower chest: Small bilateral pleural effusions and associated atelectasis or consolidation. Hepatobiliary: No mass or other parenchymal abnormality identified. Distended gallbladder containing numerous gallstones. No biliary ductal dilatation. No evidence of gallstone or other obstructing lesion within the common bile duct. Pancreas: Extensive inflammatory fat stranding and fluid about the pancreas and adjacent retroperitoneum. No discrete fluid collection. No pancreatic ductal dilatation. No evidence of pancreatic necrosis. Spleen:  Within normal limits in size and appearance. Adrenals/Urinary Tract: No masses identified. Fluid signal cysts of the bilateral kidneys. No evidence of hydronephrosis. Stomach/Bowel: Visualized portions within the  abdomen are unremarkable. Vascular/Lymphatic: No pathologically enlarged lymph nodes identified. No abdominal aortic aneurysm demonstrated. Other:  Trace perihepatic and perisplenic ascites. Musculoskeletal: No suspicious bone lesions identified. IMPRESSION: 1. Extensive inflammatory fat stranding and fluid about the pancreas and adjacent retroperitoneum, consistent with acute pancreatitis. No discrete fluid collection. No evidence of parenchymal necrosis. 2. Distended gallbladder containing numerous gallstones. No biliary ductal dilatation. No evidence of gallstone or other obstructing lesion within the common bile duct. Patency of the cystic duct may be evaluated by HIDA or ERCP if desired 3. Trace perihepatic and perisplenic ascites. 4. Small bilateral pleural effusions and associated atelectasis or consolidation. Electronically Signed   By: Eddie Candle M.D.   On: 07/11/2019 11:49   MR ABDOMEN MRCP W WO CONTAST  Result Date: 07/11/2019 CLINICAL DATA:  Pancreatitis, cholelithiasis EXAM: MRI ABDOMEN WITHOUT AND WITH CONTRAST (INCLUDING MRCP) TECHNIQUE: Multiplanar multisequence MR imaging of the abdomen was performed both before and after the administration of intravenous contrast. Heavily T2-weighted images of the biliary and pancreatic ducts were obtained, and three-dimensional MRCP images were rendered by post processing. CONTRAST:  67mL GADAVIST GADOBUTROL 1 MMOL/ML IV SOLN COMPARISON:  CT abdomen pelvis, 07/09/2019 FINDINGS: Lower chest: Small bilateral pleural effusions and associated atelectasis or consolidation. Hepatobiliary: No mass or other parenchymal abnormality identified. Distended gallbladder containing numerous gallstones. No biliary ductal dilatation. No evidence of gallstone or other obstructing lesion within the common bile duct. Pancreas: Extensive inflammatory fat stranding and fluid about the pancreas and adjacent retroperitoneum. No discrete fluid collection. No pancreatic ductal  dilatation. No evidence of pancreatic necrosis. Spleen:  Within normal limits in size and appearance. Adrenals/Urinary Tract: No masses identified. Fluid signal cysts of the bilateral kidneys. No evidence of hydronephrosis. Stomach/Bowel: Visualized portions within the abdomen are unremarkable. Vascular/Lymphatic: No pathologically enlarged lymph nodes identified. No abdominal aortic aneurysm demonstrated. Other:  Trace perihepatic and perisplenic ascites. Musculoskeletal: No suspicious bone lesions identified. IMPRESSION: 1. Extensive inflammatory fat stranding and fluid about the pancreas and adjacent  retroperitoneum, consistent with acute pancreatitis. No discrete fluid collection. No evidence of parenchymal necrosis. 2. Distended gallbladder containing numerous gallstones. No biliary ductal dilatation. No evidence of gallstone or other obstructing lesion within the common bile duct. Patency of the cystic duct may be evaluated by HIDA or ERCP if desired 3. Trace perihepatic and perisplenic ascites. 4. Small bilateral pleural effusions and associated atelectasis or consolidation. Electronically Signed   By: Eddie Candle M.D.   On: 07/11/2019 11:49    potassium chloride 10 mEq (07/11/19 1022)   Anti-infectives (From admission, onward)   None      Assessment/Plan Hypertension CKD Dehydration  Acute pancreatitis Cholelithiasis  FEN: No IVs listed/n.p.o. ID: None DVT: SCDs Follow-up: TBD  Plan:  She still has a little pain, says it feels like it's sore right now.  Much better than before. Lipase is still up some.  I told her we would let her have some sips and chips today, recheck labs and see how she is doing tomorrow.  We will keep her NPO after MB.  I have added IV fluids.      Earnstine Regal [facility] Surgery 07/11/2019, 12:14 PM Please see Amion for pager number during day hours 7:00am-4:30pm

## 2019-07-12 ENCOUNTER — Inpatient Hospital Stay (HOSPITAL_COMMUNITY): Payer: Medicare Other

## 2019-07-12 DIAGNOSIS — K8021 Calculus of gallbladder without cholecystitis with obstruction: Secondary | ICD-10-CM

## 2019-07-12 DIAGNOSIS — K859 Acute pancreatitis without necrosis or infection, unspecified: Secondary | ICD-10-CM

## 2019-07-12 LAB — CBC WITH DIFFERENTIAL/PLATELET
Abs Immature Granulocytes: 0.11 10*3/uL — ABNORMAL HIGH (ref 0.00–0.07)
Basophils Absolute: 0 10*3/uL (ref 0.0–0.1)
Basophils Relative: 0 %
Eosinophils Absolute: 0 10*3/uL (ref 0.0–0.5)
Eosinophils Relative: 0 %
HCT: 33.9 % — ABNORMAL LOW (ref 36.0–46.0)
Hemoglobin: 11 g/dL — ABNORMAL LOW (ref 12.0–15.0)
Immature Granulocytes: 1 %
Lymphocytes Relative: 10 %
Lymphs Abs: 1.3 10*3/uL (ref 0.7–4.0)
MCH: 30.5 pg (ref 26.0–34.0)
MCHC: 32.4 g/dL (ref 30.0–36.0)
MCV: 93.9 fL (ref 80.0–100.0)
Monocytes Absolute: 1.1 10*3/uL — ABNORMAL HIGH (ref 0.1–1.0)
Monocytes Relative: 9 %
Neutro Abs: 10.1 10*3/uL — ABNORMAL HIGH (ref 1.7–7.7)
Neutrophils Relative %: 80 %
Platelets: 210 10*3/uL (ref 150–400)
RBC: 3.61 MIL/uL — ABNORMAL LOW (ref 3.87–5.11)
RDW: 13.1 % (ref 11.5–15.5)
WBC: 12.6 10*3/uL — ABNORMAL HIGH (ref 4.0–10.5)
nRBC: 0 % (ref 0.0–0.2)

## 2019-07-12 LAB — COMPREHENSIVE METABOLIC PANEL
ALT: 134 U/L — ABNORMAL HIGH (ref 0–44)
AST: 42 U/L — ABNORMAL HIGH (ref 15–41)
Albumin: 3.3 g/dL — ABNORMAL LOW (ref 3.5–5.0)
Alkaline Phosphatase: 71 U/L (ref 38–126)
Anion gap: 7 (ref 5–15)
BUN: 23 mg/dL (ref 8–23)
CO2: 25 mmol/L (ref 22–32)
Calcium: 8.3 mg/dL — ABNORMAL LOW (ref 8.9–10.3)
Chloride: 104 mmol/L (ref 98–111)
Creatinine, Ser: 1.1 mg/dL — ABNORMAL HIGH (ref 0.44–1.00)
GFR calc Af Amer: 58 mL/min — ABNORMAL LOW (ref >60)
GFR calc non Af Amer: 50 mL/min — ABNORMAL LOW (ref >60)
Glucose, Bld: 122 mg/dL — ABNORMAL HIGH (ref 70–99)
Potassium: 3.9 mmol/L (ref 3.5–5.1)
Sodium: 136 mmol/L (ref 135–145)
Total Bilirubin: 1.4 mg/dL — ABNORMAL HIGH (ref 0.3–1.2)
Total Protein: 6.7 g/dL (ref 6.5–8.1)

## 2019-07-12 LAB — LIPASE, BLOOD: Lipase: 63 U/L — ABNORMAL HIGH (ref 11–51)

## 2019-07-12 MED ORDER — DOCUSATE SODIUM 100 MG PO CAPS
100.0000 mg | ORAL_CAPSULE | Freq: Two times a day (BID) | ORAL | Status: DC
  Administered 2019-07-12 – 2019-07-14 (×4): 100 mg via ORAL
  Filled 2019-07-12 (×4): qty 1

## 2019-07-12 MED ORDER — POLYETHYLENE GLYCOL 3350 17 G PO PACK
17.0000 g | PACK | Freq: Every day | ORAL | Status: DC
  Administered 2019-07-12 – 2019-07-14 (×2): 17 g via ORAL
  Filled 2019-07-12 (×2): qty 1

## 2019-07-12 MED ORDER — BOOST / RESOURCE BREEZE PO LIQD CUSTOM
1.0000 | Freq: Three times a day (TID) | ORAL | Status: DC
  Administered 2019-07-12 – 2019-07-14 (×5): 1 via ORAL

## 2019-07-12 MED ORDER — SODIUM CHLORIDE 0.9 % IV SOLN
2.0000 g | Freq: Once | INTRAVENOUS | Status: AC
  Administered 2019-07-13: 2 g via INTRAVENOUS
  Filled 2019-07-12: qty 20

## 2019-07-12 MED ORDER — PROSOURCE PLUS PO LIQD
30.0000 mL | Freq: Two times a day (BID) | ORAL | Status: DC
  Administered 2019-07-12: 30 mL via ORAL
  Filled 2019-07-12: qty 30

## 2019-07-12 MED ORDER — METOPROLOL TARTRATE 25 MG PO TABS
12.5000 mg | ORAL_TABLET | Freq: Two times a day (BID) | ORAL | Status: DC
  Administered 2019-07-12 – 2019-07-14 (×4): 12.5 mg via ORAL
  Filled 2019-07-12 (×5): qty 1

## 2019-07-12 MED ORDER — TRAMADOL HCL 50 MG PO TABS
50.0000 mg | ORAL_TABLET | Freq: Four times a day (QID) | ORAL | Status: DC | PRN
  Administered 2019-07-12: 50 mg via ORAL
  Filled 2019-07-12: qty 1

## 2019-07-12 MED ORDER — METOPROLOL TARTRATE 25 MG PO TABS: 12.5000 mg | ORAL_TABLET | Freq: Two times a day (BID) | ORAL | Status: DC

## 2019-07-12 MED ORDER — PROSOURCE PLUS PO LIQD
30.0000 mL | Freq: Two times a day (BID) | ORAL | Status: DC
  Filled 2019-07-12 (×3): qty 30

## 2019-07-12 NOTE — Progress Notes (Signed)
Family at bedside updated on current plan of care. MD at bedside to discuss surgery plans for tomorrow. Pt resting..MD (surgery)  made aware of VS and temp and spoke with family. Will cont to monitor. Pt c/o pain Tramadol given as ordered.

## 2019-07-12 NOTE — Progress Notes (Signed)
Initial Nutrition Assessment  RD working remotely.  DOCUMENTATION CODES:   Not applicable  INTERVENTION:   - Boost Breeze po TID, each supplement provides 250 kcal and 9 grams of protein  - ProSource Plus liquid protein BID, each supplement provides 100 kcal and 15 grams of protein  NUTRITION DIAGNOSIS:   Increased nutrient needs related to acute illness as evidenced by estimated needs.  GOAL:   Patient will meet greater than or equal to 90% of their needs  MONITOR:   PO intake, Supplement acceptance, Diet advancement, Labs  REASON FOR ASSESSMENT:   Malnutrition Screening Tool    ASSESSMENT:   74 year old female who presented on 7/04 with N/V/D. PMH of HTN, CKD stage III. Admitted with acute biliary pancreatitis and hypokalemia.   RD was unable to reach pt via phone call to room. Per chart review, pt experienced acute onset N/V/D and had been feeling well prior to the acute onset. No weight history is available in pt's chart for review. Will attempt to obtain diet and weight history at follow-up.  Per Surgery note on 7/06, pt with improving gallstone pancreatitis. Plan is for surgery when pt is recovered. Pt on clear liquid diet yesterday per Surgery but was made NPO at midnight.  Per Surgery note today, plan is for clear liquid diet then NPO again at midnight tonight. Per note, likely laparoscopic cholecystectomy tomorrow if labs are okay and patient continues to improve clinically.  RD will order clear liquid supplements to aid pt in meeting kcal and protein needs.  Medications reviewed and include: colace, IV Zofran q 6 hours, miralax, IV abx IVF: LR with KCl @ 100 ml/hr  Labs reviewed: lipase 63 (trending down), elevated LFTs  NUTRITION - FOCUSED PHYSICAL EXAM:  Unable to complete at this time. RD working remotely.  Diet Order:   Diet Order            Diet NPO time specified Except for: Ice Chips, Sips with Meds  Diet effective midnight           Diet  clear liquid Room service appropriate? Yes; Fluid consistency: Thin  Diet effective now                 EDUCATION NEEDS:   No education needs have been identified at this time  Skin:  Skin Assessment: Reviewed RN Assessment  Last BM:  no documented BM  Height:   Ht Readings from Last 1 Encounters:  No data found for Ht    Weight:   Wt Readings from Last 1 Encounters:  07/12/19 80.5 kg    Estimated Nutritional Needs:   Kcal:  1700-1900  Protein:  85-100 grams  Fluid:  1.7-1.9 L    Gaynell Face, MS, RD, LDN Inpatient Clinical Dietitian Please see AMiON for contact information.

## 2019-07-12 NOTE — Progress Notes (Signed)
[facility] Progress Note     Subjective: Patient reports some soreness in upper abdomen still. Denies flatus or BM. Denies nausea. Feels bloated. Encouraged ambulation today.   Objective: Vital signs in last 24 hours: Temp:  [98.1 F (36.7 C)-101 F (38.3 C)] 98.1 F (36.7 C) (07/07 0703) Pulse Rate:  [98-120] 99 (07/07 0703) Resp:  [16-22] 18 (07/07 0604) BP: (137-160)/(65-79) 144/70 (07/07 0703) SpO2:  [88 %-96 %] 92 % (07/07 0703) Weight:  [80.5 kg] 80.5 kg (07/07 0604)    Intake/Output from previous day: 07/06 0701 - 07/07 0700 In: 1409.4 [P.O.:120; I.V.:1289.4] Out: 400 [Urine:400] Intake/Output this shift: No intake/output data recorded.  PE: General: pleasant, WD, WN female who is laying in bed in NAD HEENT: Sclera are anicteric.  PERRL.  Ears and nose without any masses or lesions.  Mouth is pink and moist Heart: regular, rate, and rhythm.  Normal s1,s2. No obvious murmurs, gallops, or rubs noted.  Palpable radial and pedal pulses bilaterally Lungs: CTAB, no wheezes, rhonchi, or rales noted.  Respiratory effort nonlabored Abd: soft, mildly ttp in upper abdomen, distended, BS hypoactive MS: all 4 extremities are symmetrical with no cyanosis, clubbing, or edema. Skin: warm and dry with no masses, lesions, or rashes Neuro: Cranial nerves 2-12 grossly intact, speech is normal Psych: A&Ox3 with an appropriate affect.   Lab Results:  Recent Labs    07/11/19 0410 07/12/19 0409  WBC 14.9* 12.6*  HGB 12.2 11.0*  HCT 36.8 33.9*  PLT 228 210   BMET Recent Labs    07/11/19 0410 07/12/19 0409  NA 137 136  K 3.3* 3.9  CL 104 104  CO2 21* 25  GLUCOSE 143* 122*  BUN 19 23  CREATININE 1.10* 1.10*  CALCIUM 8.3* 8.3*   PT/INR No results for input(s): LABPROT, INR in the last 72 hours. CMP     Component Value Date/Time   NA 136 07/12/2019 0409   K 3.9 07/12/2019 0409   CL 104 07/12/2019 0409   CO2 25 07/12/2019 0409   GLUCOSE 122 (H)  07/12/2019 0409   BUN 23 07/12/2019 0409   CREATININE 1.10 (H) 07/12/2019 0409   CALCIUM 8.3 (L) 07/12/2019 0409   PROT 6.7 07/12/2019 0409   ALBUMIN 3.3 (L) 07/12/2019 0409   AST 42 (H) 07/12/2019 0409   ALT 134 (H) 07/12/2019 0409   ALKPHOS 71 07/12/2019 0409   BILITOT 1.4 (H) 07/12/2019 0409   GFRNONAA 50 (L) 07/12/2019 0409   GFRAA 58 (L) 07/12/2019 0409   Lipase     Component Value Date/Time   LIPASE 63 (H) 07/12/2019 0409       Studies/Results: MR 3D Recon At Scanner  Result Date: 07/11/2019 CLINICAL DATA:  Pancreatitis, cholelithiasis EXAM: MRI ABDOMEN WITHOUT AND WITH CONTRAST (INCLUDING MRCP) TECHNIQUE: Multiplanar multisequence MR imaging of the abdomen was performed both before and after the administration of intravenous contrast. Heavily T2-weighted images of the biliary and pancreatic ducts were obtained, and three-dimensional MRCP images were rendered by post processing. CONTRAST:  50mL GADAVIST GADOBUTROL 1 MMOL/ML IV SOLN COMPARISON:  CT abdomen pelvis, 07/09/2019 FINDINGS: Lower chest: Small bilateral pleural effusions and associated atelectasis or consolidation. Hepatobiliary: No mass or other parenchymal abnormality identified. Distended gallbladder containing numerous gallstones. No biliary ductal dilatation. No evidence of gallstone or other obstructing lesion within the common bile duct. Pancreas: Extensive inflammatory fat stranding and fluid about the pancreas and adjacent retroperitoneum. No discrete fluid collection. No pancreatic ductal dilatation. No evidence of pancreatic  necrosis. Spleen:  Within normal limits in size and appearance. Adrenals/Urinary Tract: No masses identified. Fluid signal cysts of the bilateral kidneys. No evidence of hydronephrosis. Stomach/Bowel: Visualized portions within the abdomen are unremarkable. Vascular/Lymphatic: No pathologically enlarged lymph nodes identified. No abdominal aortic aneurysm demonstrated. Other:  Trace perihepatic  and perisplenic ascites. Musculoskeletal: No suspicious bone lesions identified. IMPRESSION: 1. Extensive inflammatory fat stranding and fluid about the pancreas and adjacent retroperitoneum, consistent with acute pancreatitis. No discrete fluid collection. No evidence of parenchymal necrosis. 2. Distended gallbladder containing numerous gallstones. No biliary ductal dilatation. No evidence of gallstone or other obstructing lesion within the common bile duct. Patency of the cystic duct may be evaluated by HIDA or ERCP if desired 3. Trace perihepatic and perisplenic ascites. 4. Small bilateral pleural effusions and associated atelectasis or consolidation. Electronically Signed   By: Eddie Candle M.D.   On: 07/11/2019 11:49   MR ABDOMEN MRCP W WO CONTAST  Result Date: 07/11/2019 CLINICAL DATA:  Pancreatitis, cholelithiasis EXAM: MRI ABDOMEN WITHOUT AND WITH CONTRAST (INCLUDING MRCP) TECHNIQUE: Multiplanar multisequence MR imaging of the abdomen was performed both before and after the administration of intravenous contrast. Heavily T2-weighted images of the biliary and pancreatic ducts were obtained, and three-dimensional MRCP images were rendered by post processing. CONTRAST:  49mL GADAVIST GADOBUTROL 1 MMOL/ML IV SOLN COMPARISON:  CT abdomen pelvis, 07/09/2019 FINDINGS: Lower chest: Small bilateral pleural effusions and associated atelectasis or consolidation. Hepatobiliary: No mass or other parenchymal abnormality identified. Distended gallbladder containing numerous gallstones. No biliary ductal dilatation. No evidence of gallstone or other obstructing lesion within the common bile duct. Pancreas: Extensive inflammatory fat stranding and fluid about the pancreas and adjacent retroperitoneum. No discrete fluid collection. No pancreatic ductal dilatation. No evidence of pancreatic necrosis. Spleen:  Within normal limits in size and appearance. Adrenals/Urinary Tract: No masses identified. Fluid signal cysts of the  bilateral kidneys. No evidence of hydronephrosis. Stomach/Bowel: Visualized portions within the abdomen are unremarkable. Vascular/Lymphatic: No pathologically enlarged lymph nodes identified. No abdominal aortic aneurysm demonstrated. Other:  Trace perihepatic and perisplenic ascites. Musculoskeletal: No suspicious bone lesions identified. IMPRESSION: 1. Extensive inflammatory fat stranding and fluid about the pancreas and adjacent retroperitoneum, consistent with acute pancreatitis. No discrete fluid collection. No evidence of parenchymal necrosis. 2. Distended gallbladder containing numerous gallstones. No biliary ductal dilatation. No evidence of gallstone or other obstructing lesion within the common bile duct. Patency of the cystic duct may be evaluated by HIDA or ERCP if desired 3. Trace perihepatic and perisplenic ascites. 4. Small bilateral pleural effusions and associated atelectasis or consolidation. Electronically Signed   By: Eddie Candle M.D.   On: 07/11/2019 11:49    Anti-infectives: Anti-infectives (From admission, onward)   None       Assessment/Plan Hypertension AKI on CKD stage III - Cr stable at 1.1 this AM   Acute pancreatitis Cholelithiasis - lipase 63 from 1700 on admission - AST/ALT trending down, Tbili up slightly at 1.4 from 1.0 today - mild ttp in upper abdomen - likely plan for laparoscopic cholecystectomy tomorrow if labs ok and patient continues to improve clinically   FEN: CLD, NPO after MN ID: None DVT: SCDs  Follow-up: TBD  LOS: 2 days    Norm Parcel , [facility] 07/12/2019, 9:54 AM Please see Amion for pager number during day hours 7:00am-4:30pm

## 2019-07-12 NOTE — Progress Notes (Signed)
PROGRESS NOTE  Hulda Reddix MBT:[id number] DOB: 12-18-1945 DOA: 07/09/2019 PCP: [place] Pink, MD   LOS: 2 days   Brief Narrative / Interim history: 74 year old female with history of HTN, came in with nausea, vomiting, abdominal discomfort for 24-hour prior to admission.  She was found to have acute pancreatitis, likely gallstone induced, and was admitted to the hospital.  General surgery consulted as well  Subjective / 24h Interval events: She is feeling better this morning, less abdominal pain.  No nausea or vomiting today.  Assessment & Plan: Principal Problem Acute gallstone pancreatitis-lipase was elevated into the 7000 range, she was placed on IV fluids, n.p.o. with clinical improvement.  General surgery was consulted due to concern for gallstone pancreatitis and cholelithiasis on CT, as well as elevated LFTs.  Plan for cholecystectomy possibly within 24 hours.  Active Problems Essential hypertension-on lisinopril/HCTZ at home, currently on hold  Chronic kidney disease stage IIIa-Baseline about 1.2, serum creatinine was 1.45 on admission.  Improved with hydration  Hypokalemia-replete as indicated  Scheduled Meds: . docusate sodium  100 mg Oral BID  . ondansetron (ZOFRAN) IV  4 mg Intravenous Q6H  . polyethylene glycol  17 g Oral Daily   Continuous Infusions: . [START ON 07/13/2019] cefTRIAXone (ROCEPHIN)  IV    . lactated ringers with kcl 100 mL/hr at 07/12/19 0105   PRN Meds:.acetaminophen **OR** acetaminophen, hydrALAZINE, HYDROmorphone (DILAUDID) injection, ondansetron **OR** ondansetron (ZOFRAN) IV, traMADol  Diet Orders (From admission, onward)    Start     Ordered   07/13/19 0001  Diet NPO time specified Except for: [employer], Sips with Meds  Diet effective midnight       Question Answer Comment  Except for Ice Chips   Except for Sips with Meds      07/12/19 0957   07/12/19 0956  Diet clear liquid Room service appropriate? Yes; Fluid consistency: Thin  Diet  effective now       Question Answer Comment  Room service appropriate? Yes   Fluid consistency: Thin      07/12/19 0957          DVT prophylaxis: SCDs Start: 07/10/19 0123     Code Status: Full Code  Family Communication: No family at bedside  Status is: Inpatient  Remains inpatient appropriate because:Inpatient level of care appropriate due to severity of illness   Dispo: The patient is from: Home              Anticipated d/c is to: Home              Anticipated d/c date is: 2 days              Patient currently is not medically stable to d/c.  Consultants:  General surgery   Procedures:  None   Microbiology  None   Antimicrobials: Ceftriaxone    Objective: Vitals:   07/12/19 0243 07/12/19 0604 07/12/19 0703 07/12/19 1102  BP: (!) 141/73 (!) 160/73 (!) 144/70 (!) 162/80  Pulse: 98 (!) 107 99 (!) 106  Resp: 18 18  18   Temp: 99.7 F (37.6 C) 99.6 F (37.6 C) 98.1 F (36.7 C) (!) 100.5 F (38.1 C)  TempSrc: Oral Oral Oral Oral  SpO2: 92% 96% 92% (!) 89%  Weight:  80.5 kg      Intake/Output Summary (Last 24 hours) at 07/12/2019 1103 Last data filed at 07/12/2019 0622 Gross per 24 hour  Intake 1409.41 ml  Output 400 ml  Net 1009.41 ml  Filed Weights   07/12/19 0604  Weight: 80.5 kg    Examination:  Constitutional: NAD Eyes: no scleral icterus ENMT: Mucous membranes are moist.  Neck: normal, supple Respiratory: clear to auscultation bilaterally, no wheezing, no crackles.  Cardiovascular: Regular rate and rhythm, no murmurs / rubs / gallops. No LE edema.  Abdomen: non distended, no tenderness Musculoskeletal: no clubbing / cyanosis.  Skin: no rashes Neurologic: non focal   Data Reviewed: I have independently reviewed following labs and imaging studies   CBC: Recent Labs  Lab 07/09/19 1922 07/10/19 0457 07/11/19 0410 07/12/19 0409  WBC 14.8* 10.1 14.9* 12.6*  NEUTROABS 11.2* 8.9* 12.7* 10.1*  HGB 13.1 12.3 12.2 11.0*  HCT 39.1 37.4  36.8 33.9*  MCV 92.0 92.3 92.7 93.9  PLT 262 259 228 262   Basic Metabolic Panel: Recent Labs  Lab 07/09/19 1922 07/10/19 0457 07/11/19 0410 07/12/19 0409  NA 134* 133* 137 136  K 3.1* 4.0 3.3* 3.9  CL 97* 101 104 104  CO2 25 23 21* 25  GLUCOSE 187* 154* 143* 122*  BUN 22 22 19 23   CREATININE 1.45* 1.18* 1.10* 1.10*  CALCIUM 8.9 8.2* 8.3* 8.3*  MG  --  1.9  --   --    Liver Function Tests: Recent Labs  Lab 07/09/19 1922 07/10/19 0457 07/11/19 0410 07/12/19 0409  AST 494* 287* 82* 42*  ALT 360* 373* 210* 134*  ALKPHOS 92 85 74 71  BILITOT 1.4* 1.0 1.0 1.4*  PROT 7.6 6.7 6.7 6.7  ALBUMIN 4.1 3.8 3.5 3.3*   Coagulation Profile: No results for input(s): INR, PROTIME in the last 168 hours. HbA1C: No results for input(s): HGBA1C in the last 72 hours. CBG: No results for input(s): GLUCAP in the last 168 hours.  Recent Results (from the past 240 hour(s))  SARS Coronavirus 2 by RT PCR (hospital order, performed in [facility] hospital lab) Nasopharyngeal Nasopharyngeal Swab     Status: None   Collection Time: 07/09/19  9:58 PM   Specimen: Nasopharyngeal Swab  Result Value Ref Range Status   SARS Coronavirus 2 NEGATIVE NEGATIVE Final    Comment: (NOTE) SARS-CoV-2 target nucleic acids are NOT DETECTED.  The SARS-CoV-2 RNA is generally detectable in upper and lower respiratory specimens during the acute phase of infection. The lowest concentration of SARS-CoV-2 viral copies this assay can detect is 250 copies / mL. A negative result does not preclude SARS-CoV-2 infection and should not be used as the sole basis for treatment or other patient management decisions.  A negative result may occur with improper specimen collection / handling, submission of specimen other than nasopharyngeal swab, presence of viral mutation(s) within the areas targeted by this assay, and inadequate number of viral copies (<250 copies / mL). A negative result must be combined with  clinical observations, patient history, and epidemiological information.  Fact Sheet for Patients:   StrictlyIdeas.no  Fact Sheet for Healthcare Providers: BankingDealers.co.za  This test is not yet approved or  cleared by the [place] FDA and has been authorized for detection and/or diagnosis of SARS-CoV-2 by FDA under an Emergency Use Authorization (EUA).  This EUA will remain in effect (meaning this test can be used) for the duration of the COVID-19 declaration under Section 564(b)(1) of the Act, 21 U.S.C. section 360bbb-3(b)(1), unless the authorization is terminated or revoked sooner.  Performed at [facility], [address], [place], [place] [address]      Radiology Studies: No results found.  Marzetta Board, MD, PhD [facility]  Between 7 am - 7 pm I am available, please contact me via Amion or Securechat  Between 7 pm - 7 am I am not available, please contact night coverage MD/APP via Amion

## 2019-07-12 NOTE — Plan of Care (Signed)
  Problem: Education: Goal: Knowledge of General Education information will improve Description Including pain rating scale, medication(s)/side effects and non-pharmacologic comfort measures Outcome: Progressing   Problem: Health Behavior/Discharge Planning: Goal: Ability to manage health-related needs will improve Outcome: Progressing   

## 2019-07-12 NOTE — Progress Notes (Signed)
RED MEWS to [place].  VS continues. At resting state with family at bedside. HR remains in the 100-110's, when up to BR to ambulate HR increases to 130's MD made aware @ 1544.

## 2019-07-12 NOTE — Progress Notes (Signed)
Pt RED MEWS, heartrate and temp, MD and CN made aware and discussed. Pt alert, c/o abd discomfort. Dypsnea noted on exertion, O2 sat decreased to 89% while ambulating, at rest O2 sat stablized to 90-92% on RA. IV fluids D/C'd as ordered. Will cont to monitor. SRP, RN

## 2019-07-13 ENCOUNTER — Encounter (HOSPITAL_COMMUNITY): Payer: Self-pay | Admitting: Family Medicine

## 2019-07-13 ENCOUNTER — Inpatient Hospital Stay (HOSPITAL_COMMUNITY): Payer: Medicare Other | Admitting: Certified Registered Nurse Anesthetist

## 2019-07-13 ENCOUNTER — Encounter (HOSPITAL_COMMUNITY)
Admission: EM | Disposition: A | Discharge status: Home Health | Payer: Self-pay | Source: Home / Self Care | Attending: Internal Medicine

## 2019-07-13 HISTORY — PX: CHOLECYSTECTOMY: SHX55

## 2019-07-13 LAB — CBC WITH DIFFERENTIAL/PLATELET
Abs Immature Granulocytes: 0.06 10*3/uL (ref 0.00–0.07)
Basophils Absolute: 0 10*3/uL (ref 0.0–0.1)
Basophils Relative: 0 %
Eosinophils Absolute: 0 10*3/uL (ref 0.0–0.5)
Eosinophils Relative: 0 %
HCT: 31.1 % — ABNORMAL LOW (ref 36.0–46.0)
Hemoglobin: 10.2 g/dL — ABNORMAL LOW (ref 12.0–15.0)
Immature Granulocytes: 1 %
Lymphocytes Relative: 14 %
Lymphs Abs: 1.5 10*3/uL (ref 0.7–4.0)
MCH: 31 pg (ref 26.0–34.0)
MCHC: 32.8 g/dL (ref 30.0–36.0)
MCV: 94.5 fL (ref 80.0–100.0)
Monocytes Absolute: 1.2 10*3/uL — ABNORMAL HIGH (ref 0.1–1.0)
Monocytes Relative: 11 %
Neutro Abs: 8 10*3/uL — ABNORMAL HIGH (ref 1.7–7.7)
Neutrophils Relative %: 74 %
Platelets: 205 10*3/uL (ref 150–400)
RBC: 3.29 MIL/uL — ABNORMAL LOW (ref 3.87–5.11)
RDW: 13 % (ref 11.5–15.5)
WBC: 10.8 10*3/uL — ABNORMAL HIGH (ref 4.0–10.5)
nRBC: 0 % (ref 0.0–0.2)

## 2019-07-13 LAB — COMPREHENSIVE METABOLIC PANEL
ALT: 101 U/L — ABNORMAL HIGH (ref 0–44)
AST: 41 U/L (ref 15–41)
Albumin: 2.9 g/dL — ABNORMAL LOW (ref 3.5–5.0)
Alkaline Phosphatase: 89 U/L (ref 38–126)
Anion gap: 8 (ref 5–15)
BUN: 22 mg/dL (ref 8–23)
CO2: 25 mmol/L (ref 22–32)
Calcium: 8 mg/dL — ABNORMAL LOW (ref 8.9–10.3)
Chloride: 102 mmol/L (ref 98–111)
Creatinine, Ser: 0.96 mg/dL (ref 0.44–1.00)
GFR calc Af Amer: >60 mL/min (ref >60)
GFR calc non Af Amer: 59 mL/min — ABNORMAL LOW (ref >60)
Glucose, Bld: 129 mg/dL — ABNORMAL HIGH (ref 70–99)
Potassium: 3.5 mmol/L (ref 3.5–5.1)
Sodium: 135 mmol/L (ref 135–145)
Total Bilirubin: 1.4 mg/dL — ABNORMAL HIGH (ref 0.3–1.2)
Total Protein: 6.3 g/dL — ABNORMAL LOW (ref 6.5–8.1)

## 2019-07-13 LAB — MRSA PCR SCREENING: MRSA by PCR: NEGATIVE

## 2019-07-13 LAB — LIPASE, BLOOD: Lipase: 61 U/L — ABNORMAL HIGH (ref 11–51)

## 2019-07-13 SURGERY — LAPAROSCOPIC CHOLECYSTECTOMY WITH INTRAOPERATIVE CHOLANGIOGRAM
Anesthesia: General

## 2019-07-13 MED ORDER — METHOCARBAMOL 500 MG PO TABS
750.0000 mg | ORAL_TABLET | Freq: Three times a day (TID) | ORAL | Status: DC
  Administered 2019-07-13 – 2019-07-14 (×3): 750 mg via ORAL
  Filled 2019-07-13 (×3): qty 2

## 2019-07-13 MED ORDER — PROPOFOL 10 MG/ML IV BOLUS
INTRAVENOUS | Status: DC | PRN
  Administered 2019-07-13: 120 mg via INTRAVENOUS

## 2019-07-13 MED ORDER — SUGAMMADEX SODIUM 200 MG/2ML IV SOLN
INTRAVENOUS | Status: DC | PRN
  Administered 2019-07-13: 320 mg via INTRAVENOUS

## 2019-07-13 MED ORDER — MEPERIDINE HCL 50 MG/ML IJ SOLN: 6.2500 mg | INTRAMUSCULAR | Status: DC | PRN

## 2019-07-13 MED ORDER — LACTATED RINGERS IV SOLN: INTRAVENOUS | Status: DC

## 2019-07-13 MED ORDER — PROPOFOL 10 MG/ML IV BOLUS
INTRAVENOUS | Status: AC
  Filled 2019-07-13: qty 20

## 2019-07-13 MED ORDER — POTASSIUM CHLORIDE IN NACL 20-0.9 MEQ/L-% IV SOLN
INTRAVENOUS | Status: AC
  Filled 2019-07-13: qty 1000

## 2019-07-13 MED ORDER — HYDROMORPHONE HCL 1 MG/ML IJ SOLN
0.2500 mg | INTRAMUSCULAR | Status: DC | PRN
  Administered 2019-07-13 (×2): 0.25 mg via INTRAVENOUS
  Administered 2019-07-13: 0.5 mg via INTRAVENOUS

## 2019-07-13 MED ORDER — EPHEDRINE SULFATE 50 MG/ML IJ SOLN
INTRAMUSCULAR | Status: DC | PRN
Start: 2019-07-13 — End: 2019-07-13
  Administered 2019-07-13: 5 mg via INTRAVENOUS

## 2019-07-13 MED ORDER — FENTANYL CITRATE (PF) 100 MCG/2ML IJ SOLN
INTRAMUSCULAR | Status: AC
  Filled 2019-07-13: qty 2

## 2019-07-13 MED ORDER — BUPIVACAINE LIPOSOME 1.3 % IJ SUSP
20.0000 mL | Freq: Once | INTRAMUSCULAR | Status: AC
  Administered 2019-07-13: 20 mL
  Filled 2019-07-13: qty 20

## 2019-07-13 MED ORDER — ACETAMINOPHEN 500 MG PO TABS
1000.0000 mg | ORAL_TABLET | Freq: Three times a day (TID) | ORAL | Status: DC
  Administered 2019-07-13 – 2019-07-14 (×3): 1000 mg via ORAL
  Filled 2019-07-13 (×3): qty 2

## 2019-07-13 MED ORDER — POTASSIUM CHLORIDE IN NACL 20-0.9 MEQ/L-% IV SOLN
INTRAVENOUS | Status: DC
  Filled 2019-07-13: qty 1000

## 2019-07-13 MED ORDER — EPHEDRINE 5 MG/ML INJ
INTRAVENOUS | Status: AC
  Filled 2019-07-13: qty 10

## 2019-07-13 MED ORDER — OXYCODONE HCL 5 MG PO TABS: 5.0000 mg | ORAL_TABLET | ORAL | Status: DC | PRN

## 2019-07-13 MED ORDER — HYDROMORPHONE HCL 1 MG/ML IJ SOLN
INTRAMUSCULAR | Status: AC
  Filled 2019-07-13: qty 1

## 2019-07-13 MED ORDER — LIDOCAINE 2% (20 MG/ML) 5 ML SYRINGE
INTRAMUSCULAR | Status: DC | PRN
  Administered 2019-07-13: 100 mg via INTRAVENOUS

## 2019-07-13 MED ORDER — ACETAMINOPHEN 10 MG/ML IV SOLN: 1000.0000 mg | Freq: Once | INTRAVENOUS | Status: DC | PRN

## 2019-07-13 MED ORDER — ROCURONIUM BROMIDE 10 MG/ML (PF) SYRINGE
PREFILLED_SYRINGE | INTRAVENOUS | Status: AC
  Filled 2019-07-13: qty 10

## 2019-07-13 MED ORDER — ONDANSETRON HCL 4 MG/2ML IJ SOLN
INTRAMUSCULAR | Status: DC | PRN
  Administered 2019-07-13: 4 mg via INTRAVENOUS

## 2019-07-13 MED ORDER — PROMETHAZINE HCL 25 MG/ML IJ SOLN: 6.2500 mg | INTRAMUSCULAR | Status: DC | PRN

## 2019-07-13 MED ORDER — CHLORHEXIDINE GLUCONATE 0.12 % MT SOLN
15.0000 mL | Freq: Once | OROMUCOSAL | Status: AC
  Administered 2019-07-13: 15 mL via OROMUCOSAL

## 2019-07-13 MED ORDER — LIDOCAINE 2% (20 MG/ML) 5 ML SYRINGE
INTRAMUSCULAR | Status: AC
  Filled 2019-07-13: qty 5

## 2019-07-13 MED ORDER — FENTANYL CITRATE (PF) 100 MCG/2ML IJ SOLN
INTRAMUSCULAR | Status: DC | PRN
  Administered 2019-07-13: 50 ug via INTRAVENOUS

## 2019-07-13 MED ORDER — ACETAMINOPHEN 160 MG/5ML PO SOLN: 325.0000 mg | Freq: Once | ORAL | Status: DC | PRN

## 2019-07-13 MED ORDER — 0.9 % SODIUM CHLORIDE (POUR BTL) OPTIME
TOPICAL | Status: DC | PRN
  Administered 2019-07-13: 1000 mL

## 2019-07-13 MED ORDER — PHENYLEPHRINE 40 MCG/ML (10ML) SYRINGE FOR IV PUSH (FOR BLOOD PRESSURE SUPPORT)
PREFILLED_SYRINGE | INTRAVENOUS | Status: DC | PRN
  Administered 2019-07-13: 80 ug via INTRAVENOUS
  Administered 2019-07-13 (×2): 120 ug via INTRAVENOUS

## 2019-07-13 MED ORDER — LACTATED RINGERS IR SOLN
Status: DC | PRN
  Administered 2019-07-13: 1000 mL

## 2019-07-13 MED ORDER — ROCURONIUM BROMIDE 10 MG/ML (PF) SYRINGE
PREFILLED_SYRINGE | INTRAVENOUS | Status: DC | PRN
  Administered 2019-07-13: 80 mg via INTRAVENOUS
  Administered 2019-07-13: 10 mg via INTRAVENOUS

## 2019-07-13 MED ORDER — ACETAMINOPHEN 325 MG PO TABS: 325.0000 mg | ORAL_TABLET | Freq: Once | ORAL | Status: DC | PRN

## 2019-07-13 SURGICAL SUPPLY — 44 items
APPLIER CLIP ROT 10 11.4 M/L (STAPLE) ×3
CABLE HIGH FREQUENCY MONO STRZ (ELECTRODE) IMPLANT
CHLORAPREP W/TINT 26 (MISCELLANEOUS) ×3 IMPLANT
CLIP APPLIE ROT 10 11.4 M/L (STAPLE) ×1 IMPLANT
CLIP VESOLOCK MED LG 6/CT (CLIP) IMPLANT
COVER MAYO STAND STRL (DRAPES) IMPLANT
COVER SURGICAL LIGHT HANDLE (MISCELLANEOUS) ×3 IMPLANT
COVER WAND RF STERILE (DRAPES) IMPLANT
DECANTER SPIKE VIAL GLASS SM (MISCELLANEOUS) ×3 IMPLANT
DERMABOND ADVANCED (GAUZE/BANDAGES/DRESSINGS) ×2
DERMABOND ADVANCED .7 DNX12 (GAUZE/BANDAGES/DRESSINGS) ×1 IMPLANT
DRAPE C-ARM 42X120 X-RAY (DRAPES) IMPLANT
ELECT L-HOOK LAP 45CM DISP (ELECTROSURGICAL)
ELECT REM PT RETURN 15FT ADLT (MISCELLANEOUS) IMPLANT
ELECTRODE L-HOOK LAP 45CM DISP (ELECTROSURGICAL) IMPLANT
GLOVE BIO SURGEON STRL SZ 6 (GLOVE) ×3 IMPLANT
GLOVE INDICATOR 6.5 STRL GRN (GLOVE) ×3 IMPLANT
GOWN STRL REUS W/TWL 2XL LVL3 (GOWN DISPOSABLE) ×3 IMPLANT
GOWN STRL REUS W/TWL XL LVL3 (GOWN DISPOSABLE) ×6 IMPLANT
HEMOSTAT SNOW SURGICEL 2X4 (HEMOSTASIS) IMPLANT
KIT BASIN (CUSTOM PROCEDURE TRAY) ×3 IMPLANT
KIT TURNOVER KIT A (KITS) IMPLANT
L-HOOK LAP DISP 36CM (ELECTROSURGICAL) ×3
LHOOK LAP DISP 36CM (ELECTROSURGICAL) ×1 IMPLANT
PENCIL SMOKE EVACUATOR (MISCELLANEOUS) IMPLANT
POUCH RETRIEVAL ECOSAC 10 (ENDOMECHANICALS) ×1 IMPLANT
POUCH RETRIEVAL ECOSAC 10MM (ENDOMECHANICALS) ×2
POUCH SPECIMEN RETRIEVAL 10MM (ENDOMECHANICALS) IMPLANT
PROTECTOR NERVE ULNAR (MISCELLANEOUS) IMPLANT
SCISSORS LAP 5X35 DISP (ENDOMECHANICALS) ×3 IMPLANT
SET CHOLANGIOGRAPH MIX (MISCELLANEOUS) IMPLANT
SET IRRIG TUBING LAPAROSCOPIC (IRRIGATION / IRRIGATOR) ×3 IMPLANT
SET TUBE SMOKE EVAC HIGH FLOW (TUBING) IMPLANT
SLEEVE XCEL OPT CAN 5 100 (ENDOMECHANICALS) ×3 IMPLANT
STAPLER VISISTAT 35W (STAPLE) ×3 IMPLANT
SUT MNCRL AB 4-0 PS2 18 (SUTURE) ×3 IMPLANT
SUT VICRYL 0 UR6 27IN ABS (SUTURE) ×6 IMPLANT
TAPE CLOTH 4X10 WHT NS (GAUZE/BANDAGES/DRESSINGS) IMPLANT
TOWEL OR 17X26 10 PK STRL BLUE (TOWEL DISPOSABLE) ×3 IMPLANT
TOWEL OR NON WOVEN STRL DISP B (DISPOSABLE) ×3 IMPLANT
TRAY LAPAROSCOPIC (CUSTOM PROCEDURE TRAY) ×3 IMPLANT
TROCAR BLADELESS OPT 5 100 (ENDOMECHANICALS) ×3 IMPLANT
TROCAR XCEL BLUNT TIP 100MML (ENDOMECHANICALS) ×3 IMPLANT
TROCAR XCEL NON-BLD 11X100MML (ENDOMECHANICALS) ×3 IMPLANT

## 2019-07-13 NOTE — Anesthesia Preprocedure Evaluation (Signed)
Anesthesia Evaluation  Patient identified by MRN, date of birth, ID band Patient awake    Reviewed: Allergy & Precautions, NPO status , Patient's Chart, lab work & pertinent test results  Airway Mallampati: I  TM Distance: >3 FB Neck ROM: Full    Dental   Pulmonary    Pulmonary exam normal        Cardiovascular hypertension, Pt. on medications Normal cardiovascular exam     Neuro/Psych    GI/Hepatic   Endo/Other    Renal/GU Renal InsufficiencyRenal disease     Musculoskeletal   Abdominal   Peds  Hematology   Anesthesia Other Findings   Reproductive/Obstetrics                             Anesthesia Physical Anesthesia Plan  ASA: III  Anesthesia Plan: General   Post-op Pain Management:    Induction: Intravenous  PONV Risk Score and Plan: 3 and Midazolam and Dexamethasone  Airway Management Planned: Oral ETT  Additional Equipment:   Intra-op Plan:   Post-operative Plan: Extubation in OR  Informed Consent: I have reviewed the patients History and Physical, chart, labs and discussed the procedure including the risks, benefits and alternatives for the proposed anesthesia with the patient or authorized representative who has indicated his/her understanding and acceptance.       Plan Discussed with: CRNA and Surgeon  Anesthesia Plan Comments:         Anesthesia Quick Evaluation

## 2019-07-13 NOTE — Anesthesia Procedure Notes (Signed)
Procedure Name: Intubation Date/Time: 07/13/2019 2:27 PM Performed by: Claudia Desanctis, CRNA Pre-anesthesia Checklist: Patient identified, Emergency Drugs available, Suction available and Patient being monitored Patient Re-evaluated:Patient Re-evaluated prior to induction Oxygen Delivery Method: Circle system utilized Preoxygenation: Pre-oxygenation with 100% oxygen Induction Type: IV induction Ventilation: Mask ventilation without difficulty Laryngoscope Size: 2 and Miller Grade View: Grade I Tube type: Oral Tube size: 7.0 mm Number of attempts: 1 Airway Equipment and Method: Stylet Placement Confirmation: ETT inserted through vocal cords under direct vision,  positive ETCO2 and breath sounds checked- equal and bilateral Secured at: 20 cm Tube secured with: Tape Dental Injury: Teeth and Oropharynx as per pre-operative assessment

## 2019-07-13 NOTE — Anesthesia Postprocedure Evaluation (Signed)
Anesthesia Post Note  Patient: Stacie Stein  Procedure(s) Performed: LAPAROSCOPIC CHOLECYSTECTOMY WITH POSSIBLE INTRAOPERATIVE CHOLANGIOGRAM (N/A )     Patient location during evaluation: PACU Anesthesia Type: General Level of consciousness: awake and alert Pain management: pain level controlled Vital Signs Assessment: post-procedure vital signs reviewed and stable Respiratory status: spontaneous breathing, nonlabored ventilation, respiratory function stable and patient connected to nasal cannula oxygen Cardiovascular status: blood pressure returned to baseline and stable Postop Assessment: no apparent nausea or vomiting Anesthetic complications: no   No complications documented.  Last Vitals:  Vitals:   07/13/19 1645 07/13/19 1706  BP: (!) 156/75 (!) 160/79  Pulse: 90 98  Resp: 13 16  Temp: 37.7 C 37.5 C  SpO2: 94% 93%    Last Pain:  Vitals:   07/13/19 1706  TempSrc: Oral  PainSc: 0-No pain                 Effie Berkshire

## 2019-07-13 NOTE — Progress Notes (Signed)
PROGRESS NOTE  Stacie Stein XNT:[id number] DOB: 1945/07/17 DOA: 07/09/2019 PCP: Maryland Pink, MD   LOS: 3 days   Brief Narrative / Interim history: 74 year old female with history of HTN, came in with nausea, vomiting, abdominal discomfort for 24-hour prior to admission.  She was found to have acute pancreatitis, likely gallstone induced, and was admitted to the hospital.  General surgery consulted as well  Subjective / 24h Interval events: No significant issues overnight.  Mild abdominal pain this morning.  Awaiting surgery  Assessment & Plan: Principal Problem Acute gallstone pancreatitis-lipase was elevated into the 7000 range, she was placed on IV fluids, n.p.o. with clinical improvement.  General surgery was consulted due to concern for gallstone pancreatitis and cholelithiasis on CT, as well as elevated LFTs.  Plan for cholecystectomy today.  I had to stop her fluids yesterday as she was net +4 L and had some dyspnea with exertion  Active Problems Essential hypertension-on lisinopril/HCTZ at home, currently on hold  Chronic kidney disease stage IIIa-Baseline about 1.2, serum creatinine was 1.45 on admission.  Improved with hydration  Hypokalemia-replete as indicated  Scheduled Meds: . (feeding supplement) PROSource Plus  30 mL Oral BID BM  . docusate sodium  100 mg Oral BID  . feeding supplement  1 Container Oral TID BM  . metoprolol tartrate  12.5 mg Oral BID  . ondansetron (ZOFRAN) IV  4 mg Intravenous Q6H  . polyethylene glycol  17 g Oral Daily   Continuous Infusions: . cefTRIAXone (ROCEPHIN)  IV    . lactated ringers with kcl Stopped (07/12/19 1900)   PRN Meds:.acetaminophen **OR** acetaminophen, hydrALAZINE, HYDROmorphone (DILAUDID) injection, ondansetron **OR** ondansetron (ZOFRAN) IV, traMADol  Diet Orders (From admission, onward)    Start     Ordered   07/13/19 0001  Diet NPO time specified Except for: Ice Chips, Sips with Meds  Diet effective midnight         Question Answer Comment  Except for Ice Chips   Except for Sips with Meds      07/12/19 0957          DVT prophylaxis: SCDs Start: 07/10/19 0123     Code Status: Full Code  Family Communication: No family at bedside  Status is: Inpatient  Remains inpatient appropriate because:Inpatient level of care appropriate due to severity of illness   Dispo: The patient is from: Home              Anticipated d/c is to: Home              Anticipated d/c date is: 2 days              Patient currently is not medically stable to d/c.  Consultants:  General surgery   Procedures:  None   Microbiology  None   Antimicrobials: Ceftriaxone    Objective: Vitals:   07/12/19 2106 07/13/19 0014 07/13/19 0432 07/13/19 0718  BP: (!) 149/80 (!) 144/66 (!) 150/67 (!) 160/78  Pulse: (!) 103 93 91 97  Resp: 18 20 18 16   Temp: 97.7 F (36.5 C) 99.6 F (37.6 C) 99.3 F (37.4 C) 99.3 F (37.4 C)  TempSrc: Oral Oral Oral   SpO2: 95% 93% 90% 92%  Weight:        Intake/Output Summary (Last 24 hours) at 07/13/2019 1044 Last data filed at 07/12/2019 2054 Gross per 24 hour  Intake --  Output 500 ml  Net -500 ml   Filed Weights   07/12/19 0604  Weight: 80.5 kg    Examination:  Constitutional: No distress, in good Eyes: No scleral icterus ENMT: Moist mucous membranes Neck: normal, supple Respiratory: Lungs are clear, no wheezing, no crackles, diminished at the bases Cardiovascular: Regular rate and rhythm, no murmurs.  No peripheral edema Abdomen: Soft, nontender, nondistended, bowel sounds positive Musculoskeletal: no clubbing / cyanosis.  Skin: No rashes appreciated Neurologic: No focal deficits  Data Reviewed: I have independently reviewed following labs and imaging studies   CBC: Recent Labs  Lab 07/09/19 1922 07/10/19 0457 07/11/19 0410 07/12/19 0409 07/13/19 0358  WBC 14.8* 10.1 14.9* 12.6* 10.8*  NEUTROABS 11.2* 8.9* 12.7* 10.1* 8.0*  HGB 13.1 12.3 12.2 11.0*  10.2*  HCT 39.1 37.4 36.8 33.9* 31.1*  MCV 92.0 92.3 92.7 93.9 94.5  PLT 262 259 228 210 468   Basic Metabolic Panel: Recent Labs  Lab 07/09/19 1922 07/10/19 0457 07/11/19 0410 07/12/19 0409 07/13/19 0358  NA 134* 133* 137 136 135  K 3.1* 4.0 3.3* 3.9 3.5  CL 97* 101 104 104 102  CO2 25 23 21* 25 25  GLUCOSE 187* 154* 143* 122* 129*  BUN 22 22 19 23 22   CREATININE 1.45* 1.18* 1.10* 1.10* 0.96  CALCIUM 8.9 8.2* 8.3* 8.3* 8.0*  MG  --  1.9  --   --   --    Liver Function Tests: Recent Labs  Lab 07/09/19 1922 07/10/19 0457 07/11/19 0410 07/12/19 0409 07/13/19 0358  AST 494* 287* 82* 42* 41  ALT 360* 373* 210* 134* 101*  ALKPHOS 92 85 74 71 89  BILITOT 1.4* 1.0 1.0 1.4* 1.4*  PROT 7.6 6.7 6.7 6.7 6.3*  ALBUMIN 4.1 3.8 3.5 3.3* 2.9*   Coagulation Profile: No results for input(s): INR, PROTIME in the last 168 hours. HbA1C: No results for input(s): HGBA1C in the last 72 hours. CBG: No results for input(s): GLUCAP in the last 168 hours.  Recent Results (from the past 240 hour(s))  SARS Coronavirus 2 by RT PCR (hospital order, performed in [facility] hospital lab) Nasopharyngeal Nasopharyngeal Swab     Status: None   Collection Time: 07/09/19  9:58 PM   Specimen: Nasopharyngeal Swab  Result Value Ref Range Status   SARS Coronavirus 2 NEGATIVE NEGATIVE Final    Comment: (NOTE) SARS-CoV-2 target nucleic acids are NOT DETECTED.  The SARS-CoV-2 RNA is generally detectable in upper and lower respiratory specimens during the acute phase of infection. The lowest concentration of SARS-CoV-2 viral copies this assay can detect is 250 copies / mL. A negative result does not preclude SARS-CoV-2 infection and should not be used as the sole basis for treatment or other patient management decisions.  A negative result may occur with improper specimen collection / handling, submission of specimen other than nasopharyngeal swab, presence of viral mutation(s) within the areas  targeted by this assay, and inadequate number of viral copies (<250 copies / mL). A negative result must be combined with clinical observations, patient history, and epidemiological information.  Fact Sheet for Patients:   StrictlyIdeas.no  Fact Sheet for Healthcare Providers: BankingDealers.co.za  This test is not yet approved or  cleared by the [place] FDA and has been authorized for detection and/or diagnosis of SARS-CoV-2 by FDA under an Emergency Use Authorization (EUA).  This EUA will remain in effect (meaning this test can be used) for the duration of the COVID-19 declaration under Section 564(b)(1) of the Act, 21 U.S.C. section 360bbb-3(b)(1), unless the authorization is terminated or revoked sooner.  Performed at [facility], [address] [place], [place] [address]   MRSA PCR Screening     Status: None   Collection Time: 07/13/19  7:25 AM   Specimen: Nasal Mucosa; Nasopharyngeal  Result Value Ref Range Status   MRSA by PCR NEGATIVE NEGATIVE Final    Comment:        The GeneXpert MRSA Assay (FDA approved for NASAL specimens only), is one component of a comprehensive MRSA colonization surveillance program. It is not intended to diagnose MRSA infection nor to guide or monitor treatment for MRSA infections. Performed at [facility], [place] [address], [place], [place] [address]      Radiology Studies: DG CHEST PORT 1 VIEW  Result Date: 07/12/2019 CLINICAL DATA:  74 year old female with hypertension and nausea. EXAM: PORTABLE CHEST 1 VIEW COMPARISON:  None. FINDINGS: There is shallow inspiration with bibasilar atelectasis. Small bilateral pleural effusions may be present. Pneumonia is not excluded. Clinical correlation is recommended. No pneumothorax. Top-normal cardiac size. No acute osseous pathology. IMPRESSION: Shallow inspiration with bibasilar atelectasis. Pneumonia is not excluded.  Electronically Signed   By: Anner Crete M.D.   On: 07/12/2019 19:13      Marzetta Board, MD, PhD [facility]  Between 7 am - 7 pm I am available, please contact me via Amion or Securechat  Between 7 pm - 7 am I am not available, please contact night coverage MD/APP via Amion

## 2019-07-13 NOTE — Transfer of Care (Signed)
Immediate Anesthesia Transfer of Care Note  Patient: Stacie Stein  Procedure(s) Performed: LAPAROSCOPIC CHOLECYSTECTOMY WITH POSSIBLE INTRAOPERATIVE CHOLANGIOGRAM (N/A )  Patient Location: PACU  Anesthesia Type:General  Level of Consciousness: drowsy, patient cooperative and responds to stimulation  Airway & Oxygen Therapy: Patient Spontanous Breathing and Patient connected to face mask oxygen  Post-op Assessment: Report given to RN and Post -op Vital signs reviewed and stable  Post vital signs: Reviewed and stable  Last Vitals:  Vitals Value Taken Time  BP 143/72 07/13/19 1600  Temp    Pulse 100 07/13/19 1602  Resp 15 07/13/19 1602  SpO2 88 % 07/13/19 1602  Vitals shown include unvalidated device data.  Last Pain:  Vitals:   07/13/19 0730  TempSrc:   PainSc: 0-No pain      Patients Stated Pain Goal: 2 (63/84/66 5993)  Complications: No complications documented.

## 2019-07-13 NOTE — Discharge Instructions (Signed)
[facility] LAPAROSCOPIC SURGERY: POST OP INSTRUCTIONS Always review your discharge instruction sheet given to you by the facility where your surgery was performed. IF YOU HAVE DISABILITY OR FAMILY LEAVE FORMS, YOU MUST BRING THEM TO THE OFFICE FOR PROCESSING.   DO NOT GIVE THEM TO YOUR DOCTOR.  PAIN CONTROL  1. First take acetaminophen (Tylenol) AND/or ibuprofen (Advil) to control your pain after surgery.  Follow directions on package.  Taking acetaminophen (Tylenol) and/or ibuprofen (Advil) regularly after surgery will help to control your pain and lower the amount of prescription pain medication you may need.  You should not take more than 3,000 mg (3 grams) of acetaminophen (Tylenol) in 24 hours.  You should not take ibuprofen (Advil), aleve, motrin, naprosyn or other NSAIDS if you have a history of stomach ulcers or chronic kidney disease.  2. A prescription for pain medication may be given to you upon discharge.  Take your pain medication as prescribed, if you still have uncontrolled pain after taking acetaminophen (Tylenol) or ibuprofen (Advil). 3. Use ice packs to help control pain. 4. If you need a refill on your pain medication, please contact your pharmacy.  They will contact our office to request authorization. Prescriptions will not be filled after 5pm or on week-ends.  HOME MEDICATIONS 5. Take your usually prescribed medications unless otherwise directed.  DIET 6. You should follow a light diet the first few days after arrival home.  Be sure to include lots of fluids daily. Avoid fatty, fried foods.   CONSTIPATION 7. It is common to experience some constipation after surgery and if you are taking pain medication.  Increasing fluid intake and taking a stool softener (such as Colace) will usually help or prevent this problem from occurring.  A mild laxative (Milk of Magnesia or Miralax) should be taken according to package instructions if there are no bowel  movements after 48 hours.  WOUND/INCISION CARE 8. Most patients will experience some swelling and bruising in the area of the incisions.  Ice packs will help.  Swelling and bruising can take several days to resolve.  9. Unless discharge instructions indicate otherwise, follow guidelines below  a. STERI-STRIPS - you may remove your outer bandages 48 hours after surgery, and you may shower at that time.  You have steri-strips (small skin tapes) in place directly over the incision.  These strips should be left on the skin for 7-10 days.   b. DERMABOND/SKIN GLUE - you may shower in 24 hours.  The glue will flake off over the next 2-3 weeks. 10. Any sutures or staples will be removed at the office during your follow-up visit.  ACTIVITIES 11. You may resume regular (light) daily activities beginning the next day--such as daily self-care, walking, climbing stairs--gradually increasing activities as tolerated.  You may have sexual intercourse when it is comfortable.  Refrain from any heavy lifting or straining until approved by your doctor. a. You may drive when you are no longer taking prescription pain medication, you can comfortably wear a seatbelt, and you can safely maneuver your car and apply brakes.  FOLLOW-UP 12. You should see your doctor in the office for a follow-up appointment approximately 2-3 weeks after your surgery.  You should have been given your post-op/follow-up appointment when your surgery was scheduled.  If you did not receive a post-op/follow-up appointment, make sure that you call for this appointment within a day or two after you arrive home to insure a convenient appointment time.     WHEN TO CALL YOUR DOCTOR: 1. Fever over 101.0 2. Inability to urinate 3. Continued bleeding from incision. 4. Increased pain, redness, or drainage from the incision. 5. Increasing abdominal pain  The clinic staff is available to answer your questions during regular business hours.  Please don't  hesitate to call and ask to speak to one of the nurses for clinical concerns.  If you have a medical emergency, go to the nearest emergency room or call 911.  A surgeon from [facility] is always on call at the hospital. [address], [address], [place], [place]  [address] ? [address] [address], [place], [place]   [address] [phone] ? [phone] ? FAX [phone] Web site: [email or web address]  .........   Managing Your Pain After Surgery Without Opioids    Thank you for participating in our program to help patients manage their pain after surgery without opioids. This is part of our effort to provide you with the best care possible, without exposing you or your family to the risk that opioids pose.  What pain can I expect after surgery? You can expect to have some pain after surgery. This is normal. The pain is typically worse the day after surgery, and quickly begins to get better. Many studies have found that many patients are able to manage their pain after surgery with Over-the-Counter (OTC) medications such as Tylenol and Motrin. If you have a condition that does not allow you to take Tylenol or Motrin, notify your surgical team.  How will I manage my pain? The best strategy for controlling your pain after surgery is around the clock pain control with Tylenol (acetaminophen) and Motrin (ibuprofen or Advil). Alternating these medications with each other allows you to maximize your pain control. In addition to Tylenol and Motrin, you can use heating pads or ice packs on your incisions to help reduce your pain.  How will I alternate your regular strength over-the-counter pain medication? You will take a dose of pain medication every three hours. ; Start by taking 650 mg of Tylenol (2 pills of 325 mg) ; 3 hours later take 600 mg of Motrin (3 pills of 200 mg) ; 3 hours after taking the Motrin take 650 mg of Tylenol ; 3 hours after that take 600 mg of  Motrin.   - 1 -  See example - if your first dose of Tylenol is at 12:00 PM   12:00 PM Tylenol 650 mg (2 pills of 325 mg)  3:00 PM Motrin 600 mg (3 pills of 200 mg)  6:00 PM Tylenol 650 mg (2 pills of 325 mg)  9:00 PM Motrin 600 mg (3 pills of 200 mg)  Continue alternating every 3 hours   We recommend that you follow this schedule around-the-clock for at least 3 days after surgery, or until you feel that it is no longer needed. Use the table on the last page of this handout to keep track of the medications you are taking. Important: Do not take more than 3000mg of Tylenol or 3200mg of Motrin in a 24-hour period. Do not take ibuprofen/Motrin if you have a history of bleeding stomach ulcers, severe kidney disease, &/or actively taking a blood thinner  What if I still have pain? If you have pain that is not controlled with the over-the-counter pain medications (Tylenol and Motrin or Advil) you might have what we call "breakthrough" pain. You will receive a prescription for a small amount of an opioid pain medication such as   Oxycodone, Tramadol, or Tylenol with Codeine. Use these opioid pills in the first 24 hours after surgery if you have breakthrough pain. Do not take more than 1 pill every 4-6 hours.  If you still have uncontrolled pain after using all opioid pills, don't hesitate to call our staff using the number provided. We will help make sure you are managing your pain in the best way possible, and if necessary, we can provide a prescription for additional pain medication.   Day 1    Time  Name of Medication Number of pills taken  Amount of Acetaminophen  Pain Level   Comments  AM PM       AM PM       AM PM       AM PM       AM PM       AM PM       AM PM       AM PM       Total Daily amount of Acetaminophen Do not take more than  3,000 mg per day      Day 2    Time  Name of Medication Number of pills taken  Amount of Acetaminophen  Pain Level   Comments  AM  PM       AM PM       AM PM       AM PM       AM PM       AM PM       AM PM       AM PM       Total Daily amount of Acetaminophen Do not take more than  3,000 mg per day      Day 3    Time  Name of Medication Number of pills taken  Amount of Acetaminophen  Pain Level   Comments  AM PM       AM PM       AM PM       AM PM          AM PM       AM PM       AM PM       AM PM       Total Daily amount of Acetaminophen Do not take more than  3,000 mg per day      Day 4    Time  Name of Medication Number of pills taken  Amount of Acetaminophen  Pain Level   Comments  AM PM       AM PM       AM PM       AM PM       AM PM       AM PM       AM PM       AM PM       Total Daily amount of Acetaminophen Do not take more than  3,000 mg per day      Day 5    Time  Name of Medication Number of pills taken  Amount of Acetaminophen  Pain Level   Comments  AM PM       AM PM       AM PM       AM PM       AM PM       AM PM       AM PM         AM PM       Total Daily amount of Acetaminophen Do not take more than  3,000 mg per day       Day 6    Time  Name of Medication Number of pills taken  Amount of Acetaminophen  Pain Level  Comments  AM PM       AM PM       AM PM       AM PM       AM PM       AM PM       AM PM       AM PM       Total Daily amount of Acetaminophen Do not take more than  3,000 mg per day      Day 7    Time  Name of Medication Number of pills taken  Amount of Acetaminophen  Pain Level   Comments  AM PM       AM PM       AM PM       AM PM       AM PM       AM PM       AM PM       AM PM       Total Daily amount of Acetaminophen Do not take more than  3,000 mg per day        For additional information about how and where to safely dispose of unused opioid medications - https://www.morepowerfulnc.org  Disclaimer: This document contains information and/or instructional materials adapted from [facility]  for the typical patient with your condition. It does not replace medical advice from your health care provider because your experience may differ from that of the typical patient. Talk to your health care provider if you have any questions about this document, your condition or your treatment plan. Adapted from [facility]  

## 2019-07-13 NOTE — TOC Initial Note (Signed)
Transition of Care [facility]) - Initial/Assessment Note    Patient Details  Name: Stacie Stein MRN: [id number] Date of Birth: 02-23-1945  [facility] of [facility]) CM/SW Contact:    Purcell Mouton, RN Phone Number: 07/13/2019, 10:56 AM  Clinical Narrative:                 Pt from home with family support. TOC will continue to follow for discharge needs.   Expected Discharge Plan: [place] Barriers to Discharge: No Barriers Identified   Patient Goals and CMS Choice Patient states their goals for this hospitalization and ongoing recovery are:: To get better      Expected Discharge Plan and Services Expected Discharge Plan: [place]       Living arrangements for the past 2 months: Single Family Home                                      Prior Living Arrangements/Services Living arrangements for the past 2 months: Single Family Home Lives with:: Relatives, Self Patient language and need for interpreter reviewed:: No Do you feel safe going back to the place where you live?: Yes               Activities of Daily Living Home Assistive Devices/Equipment: None ADL Screening (condition at time of admission) Patient's cognitive ability adequate to safely complete daily activities?: Yes Is the patient deaf or have difficulty hearing?: No Does the patient have difficulty seeing, even when wearing glasses/contacts?: No Does the patient have difficulty concentrating, remembering, or making decisions?: No Patient able to express need for assistance with ADLs?: Yes Does the patient have difficulty dressing or bathing?: No Independently performs ADLs?: Yes (appropriate for developmental age) Does the patient have difficulty walking or climbing stairs?: No Weakness of Legs: None Weakness of Arms/Hands: None  Permission Sought/Granted Permission sought to share information with : Case Manager                Emotional  Assessment Appearance:: Appears stated age     Orientation: : Oriented to Self, Oriented to Place, Oriented to  Time, Oriented to Situation      Admission diagnosis:  Pancreatitis [K85.90] Calculus of gallbladder with biliary obstruction but without cholecystitis [K80.21] Acute pancreatitis, unspecified complication status, unspecified pancreatitis type [K85.90] Patient Active Problem List   Diagnosis Date Noted  . Hypertension   . Chronic kidney disease, stage 3a   . Hypokalemia   . Acute biliary pancreatitis 07/09/2019   PCP:  Maryland Pink, MD Pharmacy:   [place], [place] [facility] [place] [address] [place] [address] Phone: [phone] Fax: [phone]     Social Determinants of Health (SDOH) Interventions    Readmission Risk Interventions No flowsheet data found.

## 2019-07-13 NOTE — Op Note (Signed)
Laparoscopic Cholecystectomy  Indications: This patient presents with gallstone pancreatitis and will undergo laparoscopic cholecystectomy.  Pre-operative Diagnosis: gallstone pancreatitis  Post-operative Diagnosis: Same  Surgeon: Stark Klein   Assistants: Will Creig Hines, PA-C  Anesthesia: General endotracheal anesthesia and local  ASA Class: 3  Procedure Details  The patient was seen again in the Holding Room. The risks, benefits, complications, treatment options, and expected outcomes were discussed with the patient. The possibilities of  bleeding, recurrent infection, damage to nearby structures, the need for additional procedures, failure to diagnose a condition, the possible need to convert to an open procedure, and creating a complication requiring transfusion or operation were discussed with the patient. The likelihood of improving the patient's symptoms with return to their baseline status is good.    The patient and/or family concurred with the proposed plan, giving informed consent. The site of surgery properly noted. The patient was taken to Operating Room, and the procedure verified as Laparoscopic Cholecystectomy with Intraoperative Cholangiogram. A Time Out was held and the above information confirmed.  Prior to the induction of general anesthesia, antibiotic prophylaxis was administered. General endotracheal anesthesia was then administered and tolerated well. After the induction, the abdomen was prepped with Chloraprep and draped in the sterile fashion. The patient was positioned in the supine position.  Local anesthetic agent was injected into the skin near the umbilicus and an incision made. We dissected down to the abdominal fascia with blunt dissection.  The fascia was incised vertically and we entered the peritoneal cavity bluntly.  A pursestring suture of 0-Vicryl was placed around the fascial opening.  The Hasson cannula was inserted and secured with the stay suture.   Pneumoperitoneum was then created with CO2 and tolerated well without any adverse changes in the patient's vital signs. An 11-mm port was placed in the subxiphoid position.  Two 5-mm ports were placed in the right upper quadrant. All skin incisions were infiltrated with a local anesthetic agent before making the incision and placing the trocars.   We positioned the patient in reverse Trendelenburg, tilted slightly to the patient's left.  The colon and small bowel were very dilated with air and the left lateral segment of the liver was quite floppy into the surgical field.  A fifth port was placed in the left mid abdomen.  This port allowed downward compression of the gas filled bowel in order to see the infundibulum.  The gallbladder was identified, the fundus grasped and retracted cephalad. Adhesions were lysed bluntly and with the electrocautery where indicated, taking care not to injure any adjacent organs or viscus. The infundibulum was grasped and retracted laterally, exposing the peritoneum overlying the triangle of Calot. This was then divided and exposed in a blunt fashion. The cystic artery and node of calot were lying on top of the cystic duct.  This was skeletonized and clipped twice proximally and once distally.  The cystic duct was then able to be seen and was skeletonized as well.  The tissue between the duct and the liver was cleared out carefully with cautery and blunt dissection.  A critical view of the cystic duct and cystic artery was obtained.  The cystic duct was clearly identified and bluntly dissected circumferentially. The cystic duct was ligated with a clip distally and two clips proximally.    The gallbladder was dissected from the liver bed in retrograde fashion with the electrocautery. The gallbladder was removed and placed in an Ecosac.  The Ecosac and gallbladder were then removed through the umbilical  port site.  The liver bed was irrigated and inspected. Hemostasis was achieved  with the electrocautery. Copious irrigation was utilized and was repeatedly aspirated until clear.    We again inspected the right upper quadrant for hemostasis.  Pneumoperitoneum was released as we removed the trocars.   The pursestring suture was used to close the umbilical fascia.  Additional interrupted 0-0 vicryl sutures were placed in the umbilicus to fully close the fascial defect.  4-0 Monocryl was used to close the skin.   The skin was cleaned and dry, and Dermabond was applied. The patient was then extubated and brought to the recovery room in stable condition. Instrument, sponge, and needle counts were correct at closure and at the conclusion of the case.   Findings: Mild acute inflammation of the gallbladder.  Dilated small and large bowel   Estimated Blood Loss: min         Drains: none          Specimens: Gallbladder to pathology       Complications: None; patient tolerated the procedure well.         Disposition: PACU - hemodynamically stable.         Condition: stable

## 2019-07-14 ENCOUNTER — Encounter (HOSPITAL_COMMUNITY): Payer: Self-pay | Admitting: General Surgery

## 2019-07-14 LAB — COMPREHENSIVE METABOLIC PANEL
ALT: 164 U/L — ABNORMAL HIGH (ref 0–44)
AST: 156 U/L — ABNORMAL HIGH (ref 15–41)
Albumin: 2.8 g/dL — ABNORMAL LOW (ref 3.5–5.0)
Alkaline Phosphatase: 143 U/L — ABNORMAL HIGH (ref 38–126)
Anion gap: 8 (ref 5–15)
BUN: 25 mg/dL — ABNORMAL HIGH (ref 8–23)
CO2: 24 mmol/L (ref 22–32)
Calcium: 7.8 mg/dL — ABNORMAL LOW (ref 8.9–10.3)
Chloride: 102 mmol/L (ref 98–111)
Creatinine, Ser: 1.03 mg/dL — ABNORMAL HIGH (ref 0.44–1.00)
GFR calc Af Amer: >60 mL/min (ref >60)
GFR calc non Af Amer: 54 mL/min — ABNORMAL LOW (ref >60)
Glucose, Bld: 224 mg/dL — ABNORMAL HIGH (ref 70–99)
Potassium: 3.9 mmol/L (ref 3.5–5.1)
Sodium: 134 mmol/L — ABNORMAL LOW (ref 135–145)
Total Bilirubin: 0.9 mg/dL (ref 0.3–1.2)
Total Protein: 6.2 g/dL — ABNORMAL LOW (ref 6.5–8.1)

## 2019-07-14 LAB — CBC WITH DIFFERENTIAL/PLATELET
Abs Immature Granulocytes: 0.05 10*3/uL (ref 0.00–0.07)
Basophils Absolute: 0 10*3/uL (ref 0.0–0.1)
Basophils Relative: 0 %
Eosinophils Absolute: 0 10*3/uL (ref 0.0–0.5)
Eosinophils Relative: 0 %
HCT: 30.8 % — ABNORMAL LOW (ref 36.0–46.0)
Hemoglobin: 10 g/dL — ABNORMAL LOW (ref 12.0–15.0)
Immature Granulocytes: 1 %
Lymphocytes Relative: 7 %
Lymphs Abs: 0.7 10*3/uL (ref 0.7–4.0)
MCH: 30.4 pg (ref 26.0–34.0)
MCHC: 32.5 g/dL (ref 30.0–36.0)
MCV: 93.6 fL (ref 80.0–100.0)
Monocytes Absolute: 0.3 10*3/uL (ref 0.1–1.0)
Monocytes Relative: 4 %
Neutro Abs: 8.2 10*3/uL — ABNORMAL HIGH (ref 1.7–7.7)
Neutrophils Relative %: 88 %
Platelets: 235 10*3/uL (ref 150–400)
RBC: 3.29 MIL/uL — ABNORMAL LOW (ref 3.87–5.11)
RDW: 12.7 % (ref 11.5–15.5)
WBC: 9.3 10*3/uL (ref 4.0–10.5)
nRBC: 0 % (ref 0.0–0.2)

## 2019-07-14 MED ORDER — TRAMADOL HCL 50 MG PO TABS: 50.0000 mg | ORAL_TABLET | Freq: Four times a day (QID) | ORAL | 0 refills | Status: AC | PRN

## 2019-07-14 MED ORDER — PRO-STAT SUGAR FREE PO LIQD
30.0000 mL | Freq: Two times a day (BID) | ORAL | Status: DC
  Administered 2019-07-14 (×2): 30 mL via ORAL
  Filled 2019-07-14 (×2): qty 30

## 2019-07-14 NOTE — Progress Notes (Addendum)
[facility] Progress Note  1 Day Post-Op  Subjective: Tol PO, pain well controlled, +flatus, ready for dc  Objective: Vital signs in last 24 hours: Temp:  [98.9 F (37.2 C)-99.8 F (37.7 C)] 98.9 F (37.2 C) (07/09 0529) Pulse Rate:  [80-102] 80 (07/09 0529) Resp:  [13-21] 20 (07/09 0529) BP: (127-176)/(60-84) 127/60 (07/09 0529) SpO2:  [90 %-99 %] 94 % (07/09 0529) Weight:  [80.5 kg] 80.5 kg (07/08 1228) Last BM Date: 07/09/19  Intake/Output from previous day: 07/08 0701 - 07/09 0700 In: 3210.7 [P.O.:300; I.V.:2810.7; IV Piggyback:100] Out: 260 [Urine:250; Blood:10] Intake/Output this shift: Total I/O In: -  Out: 300 [Urine:300]  PE: General: pleasant, WD, WN female who is laying in bed in NAD HEENT:   Sclera are anicteric.  PERRL.  Ears and nose without any masses or lesions.  Mouth is pink and moist Heart: regular, rate, and rhythm.  Palpable radial and pedal pulses bilaterally Lungs: CTAB, no wheezes, rhonchi, or rales noted.  Respiratory effort nonlabored Abd: soft, NT, ND, +BS, incisions c/d/i    Lab Results:  Recent Labs    07/13/19 0358 07/14/19 0403  WBC 10.8* 9.3  HGB 10.2* 10.0*  HCT 31.1* 30.8*  PLT 205 235   BMET Recent Labs    07/13/19 0358 07/14/19 0403  NA 135 134*  K 3.5 3.9  CL 102 102  CO2 25 24  GLUCOSE 129* 224*  BUN 22 25*  CREATININE 0.96 1.03*  CALCIUM 8.0* 7.8*   PT/INR No results for input(s): LABPROT, INR in the last 72 hours. CMP     Component Value Date/Time   NA 134 (L) 07/14/2019 0403   K 3.9 07/14/2019 0403   CL 102 07/14/2019 0403   CO2 24 07/14/2019 0403   GLUCOSE 224 (H) 07/14/2019 0403   BUN 25 (H) 07/14/2019 0403   CREATININE 1.03 (H) 07/14/2019 0403   CALCIUM 7.8 (L) 07/14/2019 0403   PROT 6.2 (L) 07/14/2019 0403   ALBUMIN 2.8 (L) 07/14/2019 0403   AST 156 (H) 07/14/2019 0403   ALT 164 (H) 07/14/2019 0403   ALKPHOS 143 (H) 07/14/2019 0403   BILITOT 0.9 07/14/2019 0403   GFRNONAA 54 (L)  07/14/2019 0403   GFRAA >60 07/14/2019 0403   Lipase     Component Value Date/Time   LIPASE 61 (H) 07/13/2019 0358       Studies/Results: DG CHEST PORT 1 VIEW  Result Date: 07/12/2019 CLINICAL DATA:  74 year old female with hypertension and nausea. EXAM: PORTABLE CHEST 1 VIEW COMPARISON:  None. FINDINGS: There is shallow inspiration with bibasilar atelectasis. Small bilateral pleural effusions may be present. Pneumonia is not excluded. Clinical correlation is recommended. No pneumothorax. Top-normal cardiac size. No acute osseous pathology. IMPRESSION: Shallow inspiration with bibasilar atelectasis. Pneumonia is not excluded. Electronically Signed   By: Anner Crete M.D.   On: 07/12/2019 19:13    Anti-infectives: Anti-infectives (From admission, onward)   Start     Dose/Rate Route Frequency Ordered Stop   07/13/19 0600  cefTRIAXone (ROCEPHIN) 2 g in sodium chloride 0.9 % 100 mL IVPB        2 g 200 mL/hr over 30 Minutes Intravenous  Once 07/12/19 0957 07/13/19 1430       Assessment/Plan Hypertension AKI on CKD stage III    Acute pancreatitis Cholelithiasis S/p lap chole 07/13/19 - POD#1 - stable for discharge  - Rx for pain sent, follow up in AVS   LOS: 4 days    Norm Parcel ,  [facility] 07/14/2019, 10:44 AM Please see Amion for pager number during day hours 7:00am-4:30pm

## 2019-07-14 NOTE — Progress Notes (Signed)
I completed a visit with the patient following her surgery performed on yesterday. I visited Stacie Stein's room and found her sitting up watching TV. I introduced myself and asked how she was doing today. She stated that she feels much better since her surgery and is looking forward to possible discharge from the hospital today. Her daughter just left the hospital and will be returning around discharge time. I provided spiritual support through pastoral presence, words of encouragement, and by leading in prayer.    07/14/19 1200  Clinical Encounter Type  Visited With Patient  Visit Type Spiritual support  Referral From Nurse  Consult/Referral To Chaplain  Spiritual Encounters  Spiritual Needs Prayer    Chaplain Dr Redgie Grayer

## 2019-07-14 NOTE — Discharge Summary (Signed)
Physician Discharge Summary  Stacie Stein EYC:[id number] DOB: 1945/03/05 DOA: 07/09/2019  PCP: Maryland Pink, MD  Admit date: 07/09/2019 Discharge date: 07/14/2019  Admitted From: home Disposition:  home  Recommendations for Outpatient Follow-up:  1. Follow up with PCP in 1-2 weeks 2. Follow up with general surgery as scheduled  Home Health: none Equipment/Devices: none  Discharge Condition: stable CODE STATUS: Full code Diet recommendation: regular  HPI: Per admitting MD, Stacie Stein is a 74 y.o. female with medical history significant for hypertension, now presenting to the emergency department with nausea, vomiting, loose stool, and abdominal discomfort.  Patient reports that she had been in her usual state of health and was having an uneventful day when she developed nausea, upper abdominal discomfort, and went on to have nonbloody vomiting and a loose stool.  She had never experienced this previously and denies any associated fevers, chills, cough, shortness of breath, chest pain, melena, or hematochezia.  The abdominal pain is described as an ache localized to the upper abdomen without any alleviating or exacerbating factors identified.  She denies any alcohol use.  Hospital Course / Discharge diagnoses: Principal Problem Acute gallstone pancreatitis-patient was admitted to the hospital with acute gallstone pancreatitis.  Her lipase was initially elevated into the 7000 range.  General surgery has been consulted and followed patient.  She was initially treated conservatively, being n.p.o., IV fluids and improved.  Upon clinical improvement she was taken to the operating room on 7/8 and is now status post laparoscopic cholecystectomy by Dr. Barry Dienes.  She recovered well postop, tolerating food, discussed with general surgery and patient will be discharged home in stable condition with outpatient follow-up.  Active Problems Essential hypertension-resume home medications on  discharge Chronic kidney disease stage IIIa-Baseline about 1.2, serum creatinine was 1.45 on admission.  Improved with hydration Hypokalemia-repleted while hospitalized and now normalized. Elevated LFTs-due to #1  Discharge Instructions   Allergies as of 07/14/2019   No Known Allergies     Medication List    TAKE these medications   lisinopril-hydrochlorothiazide 20-25 MG tablet Commonly known as: ZESTORETIC Take 1 tablet by mouth daily after breakfast.   traMADol 50 MG tablet Commonly known as: ULTRAM Take 1 tablet (50 mg total) by mouth every 6 (six) hours as needed for moderate pain.   Vitamin D3 25 MCG (1000 UT) Caps Take 1,000 Units by mouth daily after breakfast.       Follow-up Information    [facility] Go on 08/03/2019.   Specialty: General Surgery Why: Follow up appointment scheduled for 1:45 PM. Please arrive 30 min prior to appointment time. Bring photo ID and insurance information.  Contact information: [place] [address] [phone]        Maryland Pink, MD. Schedule an appointment as soon as possible for a visit in 3 week(s).   Specialty: Family Medicine Contact information: [address] [place] [address] [phone]               Consultations:  General surgery   Procedures/Studies:  Laparoscopic cholecystectomy  CT ABDOMEN PELVIS W CONTRAST  Result Date: 07/09/2019 CLINICAL DATA:  74 year old female with pancreatitis. EXAM: CT ABDOMEN AND PELVIS WITH CONTRAST TECHNIQUE: Multidetector CT imaging of the abdomen and pelvis was performed using the standard protocol following bolus administration of intravenous contrast. CONTRAST:  44mL OMNIPAQUE IOHEXOL 300 MG/ML  SOLN COMPARISON:  None. FINDINGS: Lower chest: Bibasilar linear and streaky atelectasis. No  intra-abdominal free air. Hepatobiliary: Probable mild fatty liver. No intrahepatic biliary duct dilatation. The  gallbladder is distended there are several stones within the gallbladder. No pericholecystic fluid or evidence of acute cholecystitis by CT. There is a 5 mm stone in the neck of the gallbladder. Ultrasound may provide better evaluation of the gallbladder if clinically indicated. Pancreas: There is inflammatory changes of the pancreas with peripancreatic fluid tracking inferiorly along the mesentery and left paracolic gutter. No drainable fluid collection/abscess or pseudocyst. Spleen: Normal in size without focal abnormality. Adrenals/Urinary Tract: The adrenal glands are unremarkable. There is no hydronephrosis on either side. There is symmetric enhancement and excretion of contrast by both kidneys. Small bilateral renal cysts and additional subcentimeter hypodensities which are too small to characterize. The visualized ureters and urinary bladder appear unremarkable. Stomach/Bowel: There is sigmoid diverticulosis without active inflammatory changes. There is no bowel obstruction or active inflammation. Normal appendix. Vascular/Lymphatic: Moderate aortoiliac atherosclerotic disease. The IVC is unremarkable. The SMV, splenic vein, and main portal vein are patent. No portal venous gas. There is no adenopathy. Reproductive: Myomatous uterus with small calcific foci. Bilateral tubal ligation clips. No adnexal masses. Other: None Musculoskeletal: Degenerative changes of the spine. No acute osseous pathology. IMPRESSION: 1. Acute pancreatitis. No abscess or pseudocyst. 2. Cholelithiasis. 3. Sigmoid diverticulosis. No bowel obstruction. 4. Aortic Atherosclerosis (ICD10-I70.0). Electronically Signed   By: Anner Crete M.D.   On: 07/09/2019 22:34   MR 3D Recon At Scanner  Result Date: 07/11/2019 CLINICAL DATA:  Pancreatitis, cholelithiasis EXAM: MRI ABDOMEN WITHOUT AND WITH CONTRAST (INCLUDING MRCP) TECHNIQUE: Multiplanar multisequence MR imaging of the abdomen was performed both before and after the administration  of intravenous contrast. Heavily T2-weighted images of the biliary and pancreatic ducts were obtained, and three-dimensional MRCP images were rendered by post processing. CONTRAST:  4mL GADAVIST GADOBUTROL 1 MMOL/ML IV SOLN COMPARISON:  CT abdomen pelvis, 07/09/2019 FINDINGS: Lower chest: Small bilateral pleural effusions and associated atelectasis or consolidation. Hepatobiliary: No mass or other parenchymal abnormality identified. Distended gallbladder containing numerous gallstones. No biliary ductal dilatation. No evidence of gallstone or other obstructing lesion within the common bile duct. Pancreas: Extensive inflammatory fat stranding and fluid about the pancreas and adjacent retroperitoneum. No discrete fluid collection. No pancreatic ductal dilatation. No evidence of pancreatic necrosis. Spleen:  Within normal limits in size and appearance. Adrenals/Urinary Tract: No masses identified. Fluid signal cysts of the bilateral kidneys. No evidence of hydronephrosis. Stomach/Bowel: Visualized portions within the abdomen are unremarkable. Vascular/Lymphatic: No pathologically enlarged lymph nodes identified. No abdominal aortic aneurysm demonstrated. Other:  Trace perihepatic and perisplenic ascites. Musculoskeletal: No suspicious bone lesions identified. IMPRESSION: 1. Extensive inflammatory fat stranding and fluid about the pancreas and adjacent retroperitoneum, consistent with acute pancreatitis. No discrete fluid collection. No evidence of parenchymal necrosis. 2. Distended gallbladder containing numerous gallstones. No biliary ductal dilatation. No evidence of gallstone or other obstructing lesion within the common bile duct. Patency of the cystic duct may be evaluated by HIDA or ERCP if desired 3. Trace perihepatic and perisplenic ascites. 4. Small bilateral pleural effusions and associated atelectasis or consolidation. Electronically Signed   By: Eddie Candle M.D.   On: 07/11/2019 11:49   DG CHEST PORT 1  VIEW  Result Date: 07/12/2019 CLINICAL DATA:  74 year old female with hypertension and nausea. EXAM: PORTABLE CHEST 1 VIEW COMPARISON:  None. FINDINGS: There is shallow inspiration with bibasilar atelectasis. Small bilateral pleural effusions may be present. Pneumonia is not excluded. Clinical correlation is recommended. No pneumothorax. Top-normal cardiac  size. No acute osseous pathology. IMPRESSION: Shallow inspiration with bibasilar atelectasis. Pneumonia is not excluded. Electronically Signed   By: Anner Crete M.D.   On: 07/12/2019 19:13   MR ABDOMEN MRCP W WO CONTAST  Result Date: 07/11/2019 CLINICAL DATA:  Pancreatitis, cholelithiasis EXAM: MRI ABDOMEN WITHOUT AND WITH CONTRAST (INCLUDING MRCP) TECHNIQUE: Multiplanar multisequence MR imaging of the abdomen was performed both before and after the administration of intravenous contrast. Heavily T2-weighted images of the biliary and pancreatic ducts were obtained, and three-dimensional MRCP images were rendered by post processing. CONTRAST:  47mL GADAVIST GADOBUTROL 1 MMOL/ML IV SOLN COMPARISON:  CT abdomen pelvis, 07/09/2019 FINDINGS: Lower chest: Small bilateral pleural effusions and associated atelectasis or consolidation. Hepatobiliary: No mass or other parenchymal abnormality identified. Distended gallbladder containing numerous gallstones. No biliary ductal dilatation. No evidence of gallstone or other obstructing lesion within the common bile duct. Pancreas: Extensive inflammatory fat stranding and fluid about the pancreas and adjacent retroperitoneum. No discrete fluid collection. No pancreatic ductal dilatation. No evidence of pancreatic necrosis. Spleen:  Within normal limits in size and appearance. Adrenals/Urinary Tract: No masses identified. Fluid signal cysts of the bilateral kidneys. No evidence of hydronephrosis. Stomach/Bowel: Visualized portions within the abdomen are unremarkable. Vascular/Lymphatic: No pathologically enlarged lymph  nodes identified. No abdominal aortic aneurysm demonstrated. Other:  Trace perihepatic and perisplenic ascites. Musculoskeletal: No suspicious bone lesions identified. IMPRESSION: 1. Extensive inflammatory fat stranding and fluid about the pancreas and adjacent retroperitoneum, consistent with acute pancreatitis. No discrete fluid collection. No evidence of parenchymal necrosis. 2. Distended gallbladder containing numerous gallstones. No biliary ductal dilatation. No evidence of gallstone or other obstructing lesion within the common bile duct. Patency of the cystic duct may be evaluated by HIDA or ERCP if desired 3. Trace perihepatic and perisplenic ascites. 4. Small bilateral pleural effusions and associated atelectasis or consolidation. Electronically Signed   By: Eddie Candle M.D.   On: 07/11/2019 11:49   MM 3D SCREEN BREAST BILATERAL  Result Date: 07/04/2019 CLINICAL DATA:  Screening. EXAM: DIGITAL SCREENING BILATERAL MAMMOGRAM WITH TOMO AND CAD COMPARISON:  Previous exam(s). ACR Breast Density Category c: The breast tissue is heterogeneously dense, which may obscure small masses. FINDINGS: There are no findings suspicious for malignancy. Images were processed with CAD. IMPRESSION: No mammographic evidence of malignancy. A result letter of this screening mammogram will be mailed directly to the patient. RECOMMENDATION: Screening mammogram in one year. (Code:[id number]) BI-RADS CATEGORY  1: Negative. Electronically Signed   By: Kristopher Oppenheim M.D.   On: 07/04/2019 16:24      Subjective: - no chest pain, shortness of breath, mild abdominal soreness at the surgical site, no nausea or vomiting.   Discharge Exam: BP 127/60 (BP Location: Left Arm)   Pulse 80   Temp 98.9 F (37.2 C) (Axillary)   Resp 20   Ht 5\' 3"  (1.6 m)   Wt 80.5 kg   SpO2 94%   BMI 31.44 kg/m   General: Pt is alert, awake, not in acute distress Cardiovascular: RRR, S1/S2 +, no rubs, no gallops Respiratory: CTA bilaterally,  no wheezing, no rhonchi Abdominal: Soft, NT, ND, bowel sounds + Extremities: no edema, no cyanosis    The results of significant diagnostics from this hospitalization (including imaging, microbiology, ancillary and laboratory) are listed below for reference.     Microbiology: Recent Results (from the past 240 hour(s))  SARS Coronavirus 2 by RT PCR (hospital order, [facility]) Nasopharyngeal Nasopharyngeal Swab     Status:  None   Collection Time: 07/09/19  9:58 PM   Specimen: Nasopharyngeal Swab  Result Value Ref Range Status   SARS Coronavirus 2 NEGATIVE NEGATIVE Final    Comment: (NOTE) SARS-CoV-2 target nucleic acids are NOT DETECTED.  The SARS-CoV-2 RNA is generally detectable in upper and lower respiratory specimens during the acute phase of infection. The lowest concentration of SARS-CoV-2 viral copies this assay can detect is 250 copies / mL. A negative result does not preclude SARS-CoV-2 infection and should not be used as the sole basis for treatment or other patient management decisions.  A negative result may occur with improper specimen collection / handling, submission of specimen other than nasopharyngeal swab, presence of viral mutation(s) within the areas targeted by this assay, and inadequate number of viral copies (<250 copies / mL). A negative result must be combined with clinical observations, patient history, and epidemiological information.  Fact Sheet for Patients:   StrictlyIdeas.no  Fact Sheet for Healthcare Providers: BankingDealers.co.za  This test is not yet approved or  cleared by the [place] FDA and has been authorized for detection and/or diagnosis of SARS-CoV-2 by FDA under an Emergency Use Authorization (EUA).  This EUA will remain in effect (meaning this test can be used) for the duration of the COVID-19 declaration under Section 564(b)(1) of the Act, 21  U.S.C. section 360bbb-3(b)(1), unless the authorization is terminated or revoked sooner.  Performed at [facility], [address] [place], [place] [address]   MRSA PCR Screening     Status: None   Collection Time: 07/13/19  7:25 AM   Specimen: Nasal Mucosa; Nasopharyngeal  Result Value Ref Range Status   MRSA by PCR NEGATIVE NEGATIVE Final    Comment:        The GeneXpert MRSA Assay (FDA approved for NASAL specimens only), is one component of a comprehensive MRSA colonization surveillance program. It is not intended to diagnose MRSA infection nor to guide or monitor treatment for MRSA infections. Performed at [facility], [place] [address] [place], [place] [address]      Labs: Basic Metabolic Panel: Recent Labs  Lab 07/10/19 0457 07/11/19 0410 07/12/19 0409 07/13/19 0358 07/14/19 0403  NA 133* 137 136 135 134*  K 4.0 3.3* 3.9 3.5 3.9  CL 101 104 104 102 102  CO2 23 21* 25 25 24   GLUCOSE 154* 143* 122* 129* 224*  BUN 22 19 23 22  25*  CREATININE 1.18* 1.10* 1.10* 0.96 1.03*  CALCIUM 8.2* 8.3* 8.3* 8.0* 7.8*  MG 1.9  --   --   --   --    Liver Function Tests: Recent Labs  Lab 07/10/19 0457 07/11/19 0410 07/12/19 0409 07/13/19 0358 07/14/19 0403  AST 287* 82* 42* 41 156*  ALT 373* 210* 134* 101* 164*  ALKPHOS 85 74 71 89 143*  BILITOT 1.0 1.0 1.4* 1.4* 0.9  PROT 6.7 6.7 6.7 6.3* 6.2*  ALBUMIN 3.8 3.5 3.3* 2.9* 2.8*   CBC: Recent Labs  Lab 07/10/19 0457 07/11/19 0410 07/12/19 0409 07/13/19 0358 07/14/19 0403  WBC 10.1 14.9* 12.6* 10.8* 9.3  NEUTROABS 8.9* 12.7* 10.1* 8.0* 8.2*  HGB 12.3 12.2 11.0* 10.2* 10.0*  HCT 37.4 36.8 33.9* 31.1* 30.8*  MCV 92.3 92.7 93.9 94.5 93.6  PLT 259 228 210 205 235   CBG: No results for input(s): GLUCAP in the last 168 hours. Hgb A1c No results for input(s): HGBA1C in the last 72 hours. Lipid Profile No results for input(s):  CHOL, HDL, LDLCALC, TRIG, CHOLHDL, LDLDIRECT in the last  72 hours. Thyroid function studies No results for input(s): TSH, T4TOTAL, T3FREE, THYROIDAB in the last 72 hours.  Invalid input(s): FREET3 Urinalysis    Component Value Date/Time   COLORURINE YELLOW 07/09/2019 2050   APPEARANCEUR CLEAR 07/09/2019 2050   LABSPEC 1.015 07/09/2019 2050   PHURINE 6.5 07/09/2019 2050   GLUCOSEU NEGATIVE 07/09/2019 2050   [place] NEGATIVE 07/09/2019 2050   [place] NEGATIVE 07/09/2019 2050   [place] 07/09/2019 2050   PROTEINUR NEGATIVE 07/09/2019 2050   NITRITE NEGATIVE 07/09/2019 2050   LEUKOCYTESUR SMALL (A) 07/09/2019 2050    FURTHER DISCHARGE INSTRUCTIONS:   Get Medicines reviewed and adjusted: Please take all your medications with you for your next visit with your Primary MD   Laboratory/radiological data: Please request your Primary MD to go over all hospital tests and procedure/radiological results at the follow up, please ask your Primary MD to get all Hospital records sent to his/her office.   In some cases, they will be blood work, cultures and biopsy results pending at the time of your discharge. Please request that your primary care M.D. goes through all the records of your hospital data and follows up on these results.   Also Note the following: If you experience worsening of your admission symptoms, develop shortness of breath, life threatening emergency, suicidal or homicidal thoughts you must seek medical attention immediately by calling 911 or calling your MD immediately  if symptoms less severe.   You must read complete instructions/literature along with all the possible adverse reactions/side effects for all the Medicines you take and that have been prescribed to you. Take any new Medicines after you have completely understood and accpet all the possible adverse reactions/side effects.    Do not drive when taking Pain medications or sleeping medications (Benzodaizepines)   Do not take more than prescribed Pain, Sleep  and Anxiety Medications. It is not advisable to combine anxiety,sleep and pain medications without talking with your primary care practitioner   Special Instructions: If you have smoked or chewed Tobacco  in the last 2 yrs please stop smoking, stop any regular Alcohol  and or any Recreational drug use.   Wear Seat belts while driving.   Please note: You were cared for by a hospitalist during your hospital stay. Once you are discharged, your primary care physician will handle any further medical issues. Please note that NO REFILLS for any discharge medications will be authorized once you are discharged, as it is imperative that you return to your primary care physician (or establish a relationship with a primary care physician if you do not have one) for your post hospital discharge needs so that they can reassess your need for medications and monitor your lab values.  Time coordinating discharge: 35 minutes  SIGNED:  Marzetta Board, MD, PhD 07/14/2019, 10:46 AM

## 2019-07-14 NOTE — Care Management Important Message (Signed)
Important Message  Patient Details IM Letter given to Gabriel Earing RN Case Manager to present to the Patient Name: Stacie Stein MRN: [id number] Date of Birth: 11/24/1945   Medicare Important Message Given:  Yes     Kerin Salen 07/14/2019, 10:08 AM

## 2019-07-17 LAB — SURGICAL PATHOLOGY

## 2020-05-27 ENCOUNTER — Other Ambulatory Visit: Payer: Self-pay | Admitting: Family Medicine

## 2020-05-27 DIAGNOSIS — Z1231 Encounter for screening mammogram for malignant neoplasm of breast: Secondary | ICD-10-CM

## 2020-07-22 ENCOUNTER — Ambulatory Visit
Admission: RE | Admit: 2020-07-22 | Discharge: 2020-07-22 | Disposition: A | Discharge status: Home / Self Care | Payer: Medicare Other | Source: Ambulatory Visit | Attending: Family Medicine | Admitting: Family Medicine

## 2020-07-22 ENCOUNTER — Other Ambulatory Visit: Payer: Self-pay

## 2020-07-22 DIAGNOSIS — Z1231 Encounter for screening mammogram for malignant neoplasm of breast: Secondary | ICD-10-CM | POA: Insufficient documentation

## 2021-04-08 ENCOUNTER — Other Ambulatory Visit: Payer: Self-pay | Admitting: Family Medicine

## 2021-04-08 DIAGNOSIS — Z1231 Encounter for screening mammogram for malignant neoplasm of breast: Secondary | ICD-10-CM

## 2021-07-28 ENCOUNTER — Ambulatory Visit
Admission: RE | Admit: 2021-07-28 | Discharge: 2021-07-28 | Disposition: A | Discharge status: Home / Self Care | Payer: Medicare Other | Source: Ambulatory Visit | Attending: Family Medicine | Admitting: Family Medicine

## 2021-07-28 DIAGNOSIS — Z1231 Encounter for screening mammogram for malignant neoplasm of breast: Secondary | ICD-10-CM | POA: Diagnosis present

## 2022-01-01 NOTE — H&P (Signed)
Pre-Procedure H&P   Patient ID: Stacie Stein is a 76 y.o. female.  Gastroenterology Provider: Annamaria Helling, DO  Referring Provider: Dawson Bills, NP PCP: Maryland Pink, MD  Date: 01/02/2022  HPI Ms. Stacie Stein is a 76 y.o. female who presents today for Colonoscopy for Screening colonoscopy .  Patient's last colonoscopy over 10 years ago reportedly negative.  She has no family history of colon cancer or colon polyps.  She is status postcholecystectomy. Bowel movements move regularly without melena or hematochezia.  Most recent lab work hemoglobin 12.5 MCV 91.6 platelets 294,000 creatinine 1.2   Past Medical History:  Diagnosis Date   Hypertension     Past Surgical History:  Procedure Laterality Date   CHOLECYSTECTOMY N/A 07/13/2019   Procedure: LAPAROSCOPIC CHOLECYSTECTOMY WITH POSSIBLE INTRAOPERATIVE CHOLANGIOGRAM;  Surgeon: Stark Klein, MD;  Location: [facility];  Service: General;  Laterality: N/A;    Family History No h/o GI disease or malignancy  Review of Systems  Constitutional:  Negative for activity change, appetite change, chills, diaphoresis, fatigue, fever and unexpected weight change.  HENT:  Negative for trouble swallowing and voice change.   Respiratory:  Negative for shortness of breath and wheezing.   Cardiovascular:  Negative for chest pain, palpitations and leg swelling.  Gastrointestinal:  Negative for abdominal distention, abdominal pain, anal bleeding, blood in stool, constipation, diarrhea, nausea, rectal pain and vomiting.  Musculoskeletal:  Negative for arthralgias and myalgias.  Skin:  Negative for color change and pallor.  Neurological:  Negative for dizziness, syncope and weakness.  Psychiatric/Behavioral:  Negative for confusion.   All other systems reviewed and are negative.    Medications No current facility-administered medications on file prior to encounter.   Current Outpatient Medications on File Prior to Encounter   Medication Sig Dispense Refill   Ascorbic Acid (VITAMIN C) 100 MG tablet Take 100 mg by mouth daily.     Cholecalciferol (VITAMIN D3) 25 MCG (1000 UT) CAPS Take 1,000 Units by mouth daily after breakfast.     lisinopril-hydrochlorothiazide (ZESTORETIC) 20-25 MG tablet Take 1 tablet by mouth daily after breakfast.      traMADol (ULTRAM) 50 MG tablet Take 1 tablet (50 mg total) by mouth every 6 (six) hours as needed for moderate pain. (Patient not taking: Reported on 01/02/2022) 15 tablet 0    Pertinent medications related to GI and procedure were reviewed by me with the patient prior to the procedure   Current Facility-Administered Medications:    0.9 %  sodium chloride infusion, , Intravenous, Continuous, Annamaria Helling, DO, Last Rate: 20 mL/hr at 01/02/22 0851, New Bag at 01/02/22 0851      No Known Allergies Allergies were reviewed by me prior to the procedure  Objective   Body mass index is 30.47 kg/m. Vitals:   01/02/22 0832  BP: (!) 180/66  Pulse: 88  Resp: 18  Temp: (!) 97.2 F (36.2 C)  TempSrc: Temporal  SpO2: 100%  Weight: 78 kg  Height: '5\' 3"'$  (1.6 m)     Physical Exam Vitals and nursing note reviewed.  Constitutional:      General: She is not in acute distress.    Appearance: Normal appearance. She is not ill-appearing, toxic-appearing or diaphoretic.  HENT:     Head: Normocephalic and atraumatic.     Nose: Nose normal.     Mouth/Throat:     Mouth: Mucous membranes are moist.     Pharynx: Oropharynx is clear.  Eyes:  General: No scleral icterus.    Extraocular Movements: Extraocular movements intact.  Cardiovascular:     Rate and Rhythm: Normal rate and regular rhythm.     Heart sounds: Normal heart sounds. No murmur heard.    No friction rub. No gallop.  Pulmonary:     Effort: Pulmonary effort is normal. No respiratory distress.     Breath sounds: Normal breath sounds. No wheezing, rhonchi or rales.  Abdominal:     General: Bowel sounds  are normal. There is no distension.     Palpations: Abdomen is soft.     Tenderness: There is no abdominal tenderness. There is no guarding or rebound.  Musculoskeletal:     Cervical back: Neck supple.     Right lower leg: No edema.     Left lower leg: No edema.  Skin:    General: Skin is warm and dry.     Coloration: Skin is not jaundiced or pale.  Neurological:     General: No focal deficit present.     Mental Status: She is alert and oriented to person, place, and time. Mental status is at baseline.  Psychiatric:        Mood and Affect: Mood normal.        Behavior: Behavior normal.        Thought Content: Thought content normal.        Judgment: Judgment normal.      Assessment:  Ms. Stacie Stein is a 76 y.o. female  who presents today for Colonoscopy for Screening colonoscopy .  Plan:  Colonoscopy with possible intervention today  Colonoscopy with possible biopsy, control of bleeding, polypectomy, and interventions as necessary has been discussed with the patient/patient representative. Informed consent was obtained from the patient/patient representative after explaining the indication, nature, and risks of the procedure including but not limited to death, bleeding, perforation, missed neoplasm/lesions, cardiorespiratory compromise, and reaction to medications. Opportunity for questions was given and appropriate answers were provided. Patient/patient representative has verbalized understanding is amenable to undergoing the procedure.   Annamaria Helling, DO  [facility]  Portions of the record may have been created with voice recognition software. Occasional wrong-word or 'sound-a-like' substitutions may have occurred due to the inherent limitations of voice recognition software.  Read the chart carefully and recognize, using context, where substitutions may have occurred.

## 2022-01-02 ENCOUNTER — Encounter
Admission: RE | Disposition: A | Discharge status: Home / Self Care | Payer: Self-pay | Source: Home / Self Care | Attending: Gastroenterology

## 2022-01-02 ENCOUNTER — Ambulatory Visit: Payer: Medicare Other | Admitting: Anesthesiology

## 2022-01-02 ENCOUNTER — Ambulatory Visit
Admission: RE | Admit: 2022-01-02 | Discharge: 2022-01-02 | Disposition: A | Discharge status: Home / Self Care | Payer: Medicare Other | Attending: Gastroenterology | Admitting: Gastroenterology

## 2022-01-02 ENCOUNTER — Encounter: Payer: Self-pay | Admitting: Gastroenterology

## 2022-01-02 DIAGNOSIS — D125 Benign neoplasm of sigmoid colon: Secondary | ICD-10-CM | POA: Insufficient documentation

## 2022-01-02 DIAGNOSIS — I10 Essential (primary) hypertension: Secondary | ICD-10-CM | POA: Diagnosis not present

## 2022-01-02 DIAGNOSIS — Z1211 Encounter for screening for malignant neoplasm of colon: Secondary | ICD-10-CM | POA: Diagnosis not present

## 2022-01-02 DIAGNOSIS — Z79899 Other long term (current) drug therapy: Secondary | ICD-10-CM | POA: Diagnosis not present

## 2022-01-02 DIAGNOSIS — K640 First degree hemorrhoids: Secondary | ICD-10-CM | POA: Diagnosis not present

## 2022-01-02 DIAGNOSIS — K621 Rectal polyp: Secondary | ICD-10-CM | POA: Insufficient documentation

## 2022-01-02 DIAGNOSIS — K5730 Diverticulosis of large intestine without perforation or abscess without bleeding: Secondary | ICD-10-CM | POA: Insufficient documentation

## 2022-01-02 DIAGNOSIS — Z6830 Body mass index (BMI) 30.0-30.9, adult: Secondary | ICD-10-CM | POA: Diagnosis not present

## 2022-01-02 HISTORY — PX: COLONOSCOPY WITH PROPOFOL: SHX5780

## 2022-01-02 SURGERY — COLONOSCOPY WITH PROPOFOL
Anesthesia: General

## 2022-01-02 MED ORDER — LIDOCAINE HCL (CARDIAC) PF 100 MG/5ML IV SOSY
PREFILLED_SYRINGE | INTRAVENOUS | Status: DC | PRN
  Administered 2022-01-02: 70 mg via INTRAVENOUS

## 2022-01-02 MED ORDER — PHENYLEPHRINE HCL (PRESSORS) 10 MG/ML IV SOLN
INTRAVENOUS | Status: DC | PRN
  Administered 2022-01-02 (×4): 80 ug via INTRAVENOUS

## 2022-01-02 MED ORDER — SODIUM CHLORIDE 0.9 % IV SOLN: INTRAVENOUS | Status: DC

## 2022-01-02 MED ORDER — PROPOFOL 10 MG/ML IV BOLUS
INTRAVENOUS | Status: DC | PRN
  Administered 2022-01-02: 100 ug/kg/min via INTRAVENOUS
  Administered 2022-01-02: 70 mg via INTRAVENOUS

## 2022-01-02 NOTE — Addendum Note (Signed)
Addendum  created 01/02/22 1106 by Gigi Gin, CRNA   Intraprocedure Event edited

## 2022-01-02 NOTE — Interval H&P Note (Signed)
History and Physical Interval Note: Preprocedure H&P from 01/02/22  was reviewed and there was no interval change after seeing and examining the patient.  Written consent was obtained from the patient after discussion of risks, benefits, and alternatives. Patient has consented to proceed with Colonoscopy with possible intervention   01/02/2022 9:54 AM  Stacie Stein  has presented today for surgery, with the diagnosis of colon cancer screening.  The various methods of treatment have been discussed with the patient and family. After consideration of risks, benefits and other options for treatment, the patient has consented to  Procedure(s): COLONOSCOPY WITH PROPOFOL (N/A) as a surgical intervention.  The patient's history has been reviewed, patient examined, no change in status, stable for surgery.  I have reviewed the patient's chart and labs.  Questions were answered to the patient's satisfaction.     Annamaria Helling

## 2022-01-02 NOTE — Anesthesia Preprocedure Evaluation (Signed)
Anesthesia Evaluation  Patient identified by MRN, date of birth, ID band Patient awake    Reviewed: Allergy & Precautions, NPO status , Patient's Chart, lab work & pertinent test results  Airway Mallampati: II  TM Distance: >3 FB Neck ROM: Full    Dental  (+) Teeth Intact   Pulmonary neg pulmonary ROS   Pulmonary exam normal breath sounds clear to auscultation       Cardiovascular Exercise Tolerance: Good hypertension, Pt. on medications negative cardio ROS Normal cardiovascular exam Rhythm:Regular     Neuro/Psych negative neurological ROS  negative psych ROS   GI/Hepatic negative GI ROS, Neg liver ROS,,,  Endo/Other  negative endocrine ROS  Morbid obesity  Renal/GU Renal InsufficiencyRenal diseasenegative Renal ROS  negative genitourinary   Musculoskeletal   Abdominal  (+) + obese  Peds negative pediatric ROS (+)  Hematology negative hematology ROS (+)   Anesthesia Other Findings Past Medical History: No date: Hypertension  Past Surgical History: 07/13/2019: CHOLECYSTECTOMY; N/A     Comment:  Procedure: LAPAROSCOPIC CHOLECYSTECTOMY WITH POSSIBLE               INTRAOPERATIVE CHOLANGIOGRAM;  Surgeon: Stark Klein,               MD;  Location: [facility];  Service: General;  Laterality:               N/A;  BMI    Body Mass Index: 30.47 kg/m      Reproductive/Obstetrics negative OB ROS                             Anesthesia Physical Anesthesia Plan  ASA: 2  Anesthesia Plan: General   Post-op Pain Management:    Induction: Intravenous  PONV Risk Score and Plan: Propofol infusion and TIVA  Airway Management Planned: Natural Airway  Additional Equipment:   Intra-op Plan:   Post-operative Plan:   Informed Consent: I have reviewed the patients History and Physical, chart, labs and discussed the procedure including the risks, benefits and alternatives for the proposed  anesthesia with the patient or authorized representative who has indicated his/her understanding and acceptance.     Dental Advisory Given  Plan Discussed with: CRNA and Surgeon  Anesthesia Plan Comments:        Anesthesia Quick Evaluation

## 2022-01-02 NOTE — Op Note (Signed)
[facility]astro[facility] Patient Name: Stacie Stein Procedure Date: 01/02/2022 8:43 AM MRN: [id number] Account #: [id number] Date of Birth: 12/18/1945 Admit Type: Outpatient Age: 76 Room: [facility] ENDO ROOM 1 Gender: Female Note Status: Finalized Instrument Name: Colonoscope [id number] Procedure:             Colonoscopy Indications:           Screening for colorectal malignant neoplasm Providers:             Annamaria Helling DO, DO Referring MD:          Casilda Carls, MD (Referring MD) Medicines:             Monitored Anesthesia Care Complications:         No immediate complications. Estimated blood loss:                         Minimal. Procedure:             Pre-Anesthesia Assessment:                        - Prior to the procedure, a History and Physical was                         performed, and patient medications and allergies were                         reviewed. The patient is competent. The risks and                         benefits of the procedure and the sedation options and                         risks were discussed with the patient. All questions                         were answered and informed consent was obtained.                         Patient identification and proposed procedure were                         verified by the physician, the nurse, the anesthetist                         and the technician in the endoscopy suite. Mental                         Status Examination: alert and oriented. Airway                         Examination: normal oropharyngeal airway and neck                         mobility. Respiratory Examination: clear to                         auscultation. CV Examination: RRR, no murmurs, no S3  or S4. Prophylactic Antibiotics: The patient does not                         require prophylactic antibiotics. Prior                         Anticoagulants: The patient has taken no anticoagulant                          or antiplatelet agents. ASA Grade Assessment: II - A                         patient with mild systemic disease. After reviewing                         the risks and benefits, the patient was deemed in                         satisfactory condition to undergo the procedure. The                         anesthesia plan was to use monitored anesthesia care                         (MAC). Immediately prior to administration of                         medications, the patient was re-assessed for adequacy                         to receive sedatives. The heart rate, respiratory                         rate, oxygen saturations, blood pressure, adequacy of                         pulmonary ventilation, and response to care were                         monitored throughout the procedure. The physical                         status of the patient was re-assessed after the                         procedure.                        After obtaining informed consent, the colonoscope was                         passed under direct vision. Throughout the procedure,                         the patient's blood pressure, pulse, and oxygen                         saturations were monitored continuously. The  Colonoscope was introduced through the anus and                         advanced to the the terminal ileum, with                         identification of the appendiceal orifice and IC                         valve. The colonoscopy was performed without                         difficulty. The patient tolerated the procedure well.                         The quality of the bowel preparation was evaluated                         using the BBPS [facility] Bowel Preparation Scale) with                         scores of: Right Colon = 2 (minor amount of residual                         staining, small fragments of stool and/or opaque                         liquid, but  mucosa seen well), Transverse Colon = 3                         (entire mucosa seen well with no residual staining,                         small fragments of stool or opaque liquid) and Left                         Colon = 3 (entire mucosa seen well with no residual                         staining, small fragments of stool or opaque liquid).                         The total BBPS score equals 8. The quality of the                         bowel preparation was excellent. The terminal ileum,                         ileocecal valve, appendiceal orifice, and rectum were                         photographed. Findings:      The perianal and digital rectal examinations were normal. Pertinent       negatives include normal sphincter tone.      The terminal ileum appeared normal. Estimated blood loss: none.      Retroflexion in the right colon was performed.  Three sessile polyps were found in the rectum (2) and sigmoid colon (1).       The polyps were 1 to 2 mm in size. These polyps were removed with a       jumbo cold forceps. Resection and retrieval were complete. Estimated       blood loss was minimal.      Multiple small-mouthed diverticula were found in the recto-sigmoid colon       and sigmoid colon. Estimated blood loss: none.      Non-bleeding internal hemorrhoids were found during retroflexion. The       hemorrhoids were Grade I (internal hemorrhoids that do not prolapse).       Estimated blood loss: none.      The exam was otherwise without abnormality on direct and retroflexion       views. Impression:            - The examined portion of the ileum was normal.                        - Three 1 to 2 mm polyps in the rectum and in the                         sigmoid colon, removed with a jumbo cold forceps.                         Resected and retrieved.                        - Diverticulosis in the recto-sigmoid colon and in the                         sigmoid colon.                         - Non-bleeding internal hemorrhoids.                        - The examination was otherwise normal on direct and                         retroflexion views. Recommendation:        - Patient has a contact number available for                         emergencies. The signs and symptoms of potential                         delayed complications were discussed with the patient.                         Return to normal activities tomorrow. Written                         discharge instructions were provided to the patient.                        - Discharge patient to home.                        - Resume previous  diet.                        - Continue present medications.                        - Await pathology results.                        - Repeat colonoscopy for surveillance based on                         pathology results.                        - Return to referring physician as previously                         scheduled.                        - The findings and recommendations were discussed with                         the patient. Procedure Code(s):     --- Professional ---                        [phone], Colonoscopy, flexible; with biopsy, single or                         multiple Diagnosis Code(s):     --- Professional ---                        Z12.11, Encounter for screening for malignant neoplasm                         of colon                        K64.0, First degree hemorrhoids                        D12.8, Benign neoplasm of rectum                        D12.5, Benign neoplasm of sigmoid colon                        K57.30, Diverticulosis of large intestine without                         perforation or abscess without bleeding CPT copyright 2022 American [employer] Association. All rights reserved. The codes documented in this report are preliminary and upon coder review may  be revised to meet current compliance requirements. Attending  Participation:      I personally performed the entire procedure. Volney American, DO Annamaria Helling DO, DO 01/02/2022 10:27:25 AM This report has been signed electronically. Number of Addenda: 0 Note Initiated On: 01/02/2022 8:43 AM Scope Withdrawal Time: 0 hours 15 minutes 38 seconds  Total Procedure Duration: 0 hours 19 minutes 20 seconds  Estimated Blood Loss:  Estimated blood loss was minimal.      [place]  [facility]

## 2022-01-02 NOTE — Anesthesia Postprocedure Evaluation (Signed)
Anesthesia Post Note  Patient: Stacie Stein  Procedure(s) Performed: COLONOSCOPY WITH PROPOFOL  Patient location during evaluation: PACU Anesthesia Type: General Level of consciousness: awake Pain management: satisfactory to patient Vital Signs Assessment: post-procedure vital signs reviewed and stable Respiratory status: spontaneous breathing and respiratory function stable Cardiovascular status: stable Anesthetic complications: no  No notable events documented.   Last Vitals:  Vitals:   01/02/22 1047 01/02/22 1057  BP:    Pulse:    Resp:    Temp:    SpO2: 100% 100%    Last Pain:  Vitals:   01/02/22 1057  TempSrc:   PainSc: 0-No pain                 VAN STAVEREN,Willow Shidler

## 2022-01-02 NOTE — Transfer of Care (Signed)
Immediate Anesthesia Transfer of Care Note  Patient: Stacie Stein  Procedure(s) Performed: COLONOSCOPY WITH PROPOFOL  Patient Location: PACU  Anesthesia Type:General  Level of Consciousness: sedated  Airway & Oxygen Therapy: Patient Spontanous Breathing  Post-op Assessment: Report given to RN  Post vital signs: Reviewed  Last Vitals:  Vitals Value Taken Time  BP 104/45 01/02/22 1027  Temp 36 C 01/02/22 1027  Pulse 63 01/02/22 1028  Resp 14 01/02/22 1028  SpO2 100 % 01/02/22 1028  Vitals shown include unvalidated device data.  Last Pain:  Vitals:   01/02/22 1027  TempSrc: Temporal  PainSc: Asleep         Complications: No notable events documented.

## 2022-01-03 ENCOUNTER — Encounter: Payer: Self-pay | Admitting: Gastroenterology

## 2022-01-06 LAB — SURGICAL PATHOLOGY

## 2022-07-13 ENCOUNTER — Other Ambulatory Visit: Payer: Self-pay | Admitting: Family Medicine

## 2022-07-13 DIAGNOSIS — Z1231 Encounter for screening mammogram for malignant neoplasm of breast: Secondary | ICD-10-CM

## 2022-07-30 ENCOUNTER — Ambulatory Visit
Admission: RE | Admit: 2022-07-30 | Discharge: 2022-07-30 | Disposition: A | Discharge status: Home / Self Care | Payer: Medicare Other | Source: Ambulatory Visit | Attending: Family Medicine | Admitting: Family Medicine

## 2022-07-30 DIAGNOSIS — Z1231 Encounter for screening mammogram for malignant neoplasm of breast: Secondary | ICD-10-CM | POA: Diagnosis present

## 2022-11-28 IMAGING — MG MM DIGITAL SCREENING BILAT W/ TOMO AND CAD
6 of 12 series · 6 of 36 positions shown · non-contrast
Comparison: Previous exam(s).

CLINICAL DATA: Screening.

EXAM:
DIGITAL SCREENING BILATERAL MAMMOGRAM WITH TOMOSYNTHESIS AND CAD
TECHNIQUE: Bilateral screening digital craniocaudal and mediolateral oblique
mammograms were obtained. Bilateral screening digital breast
tomosynthesis was performed. The images were evaluated with
computer-aided detection.

[L MLO synth-2D]
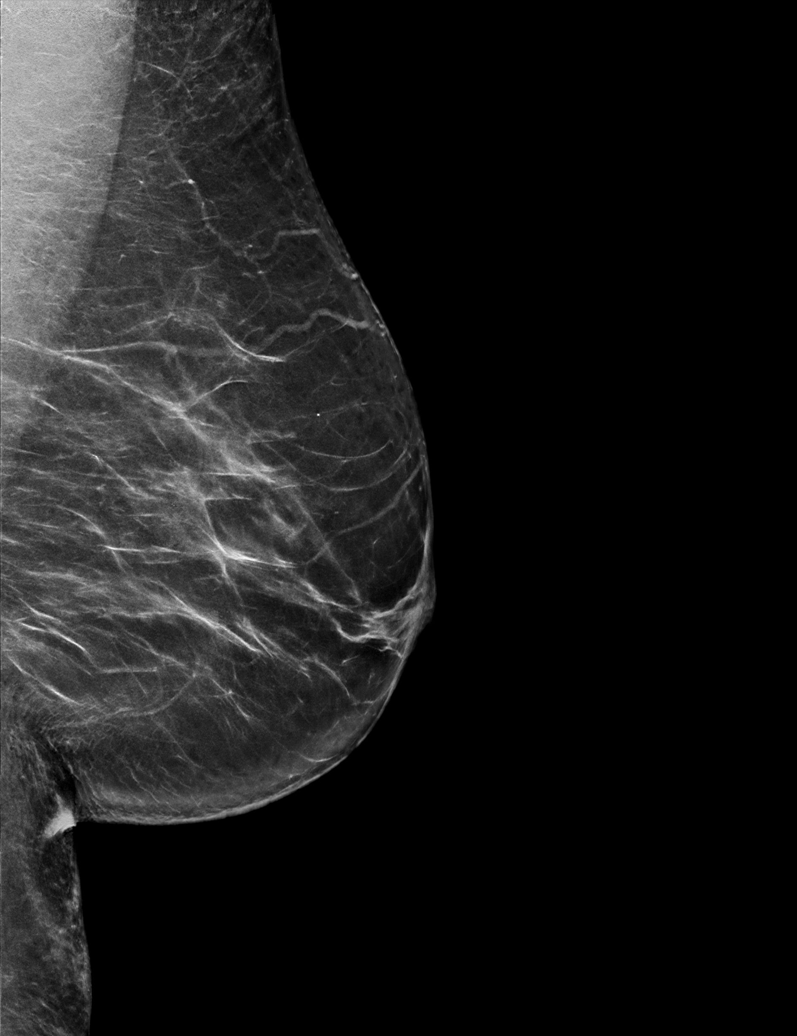

[L CC synth-2D (1 of 2)]
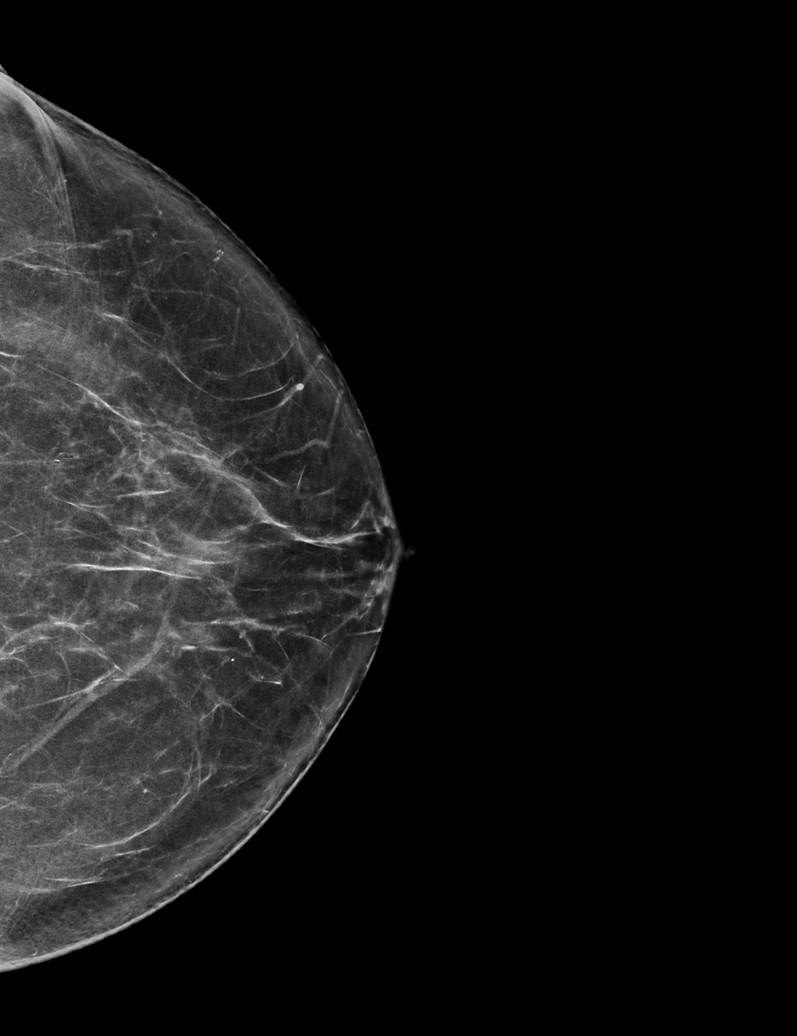

[L CC synth-2D (2 of 2)]
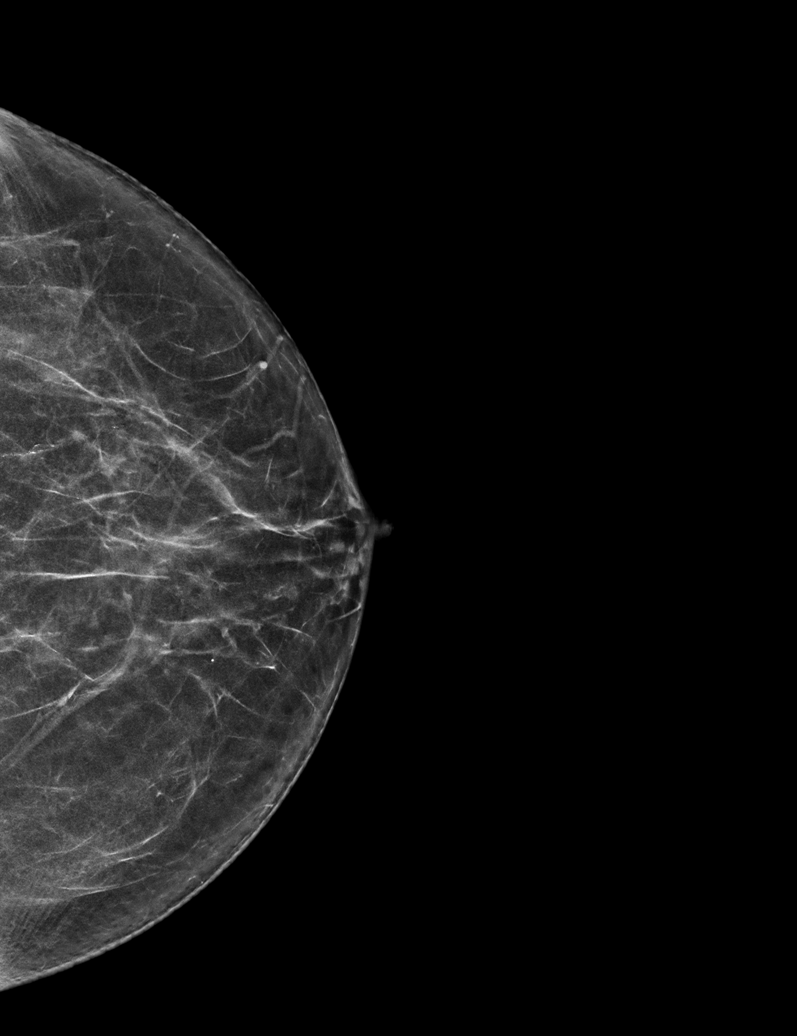

[R MLO synth-2D (1 of 2)]
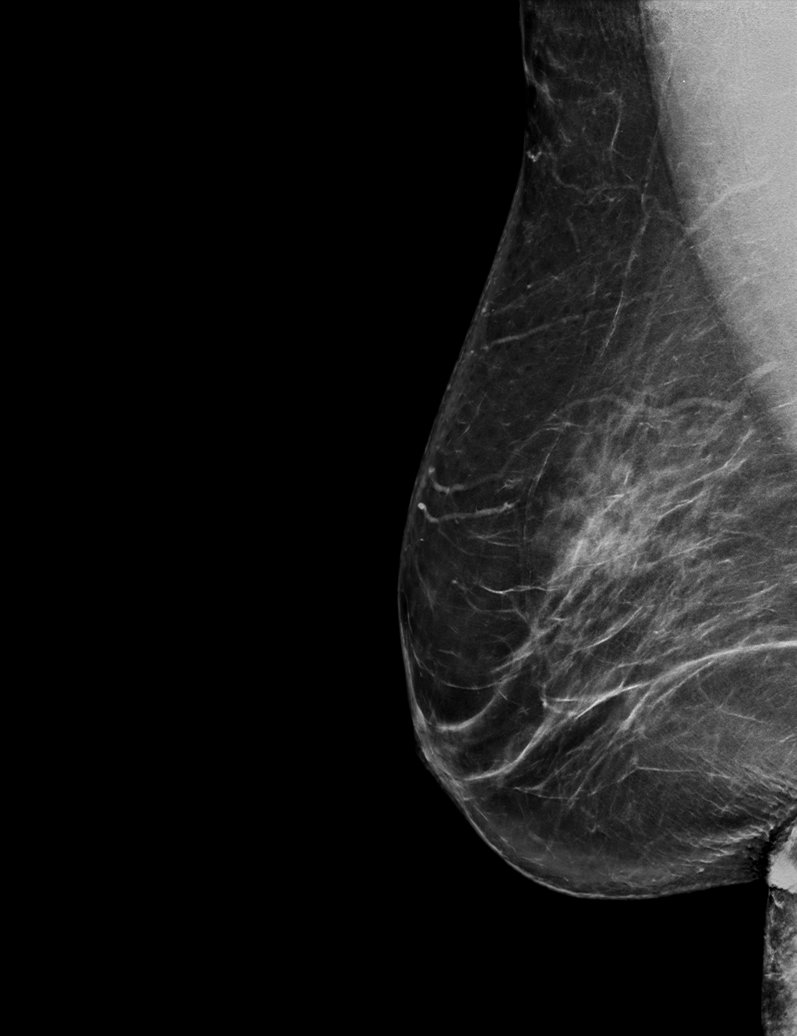

[R MLO synth-2D (2 of 2)]
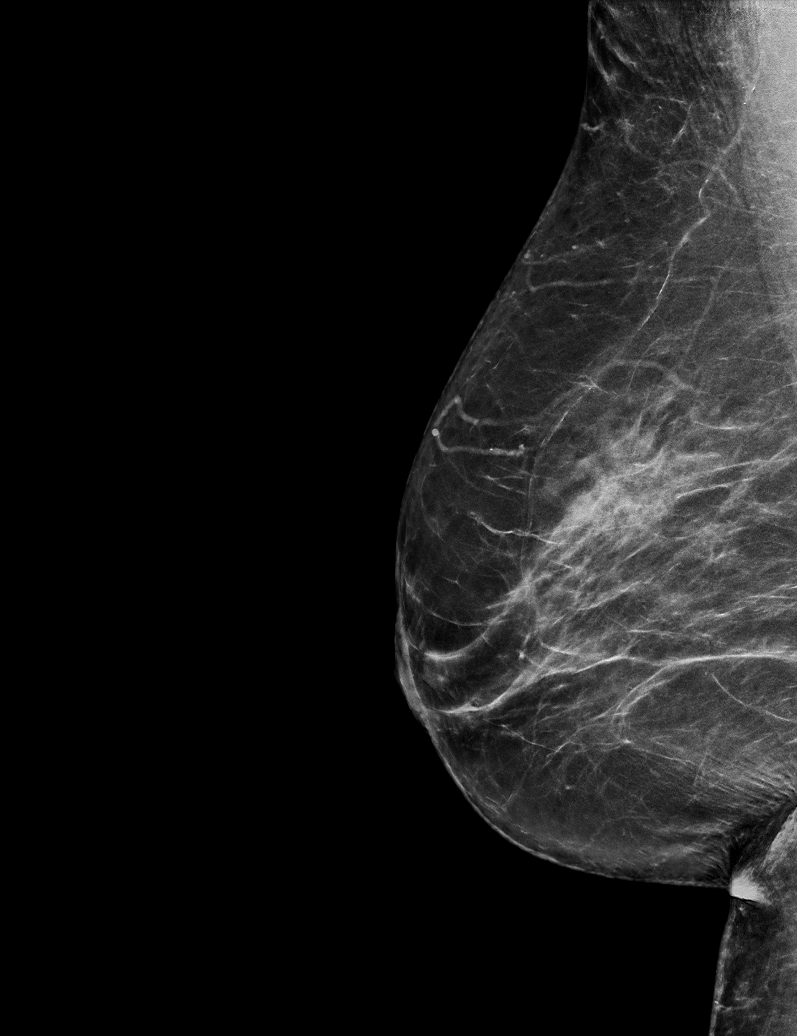

[R CC synth-2D]
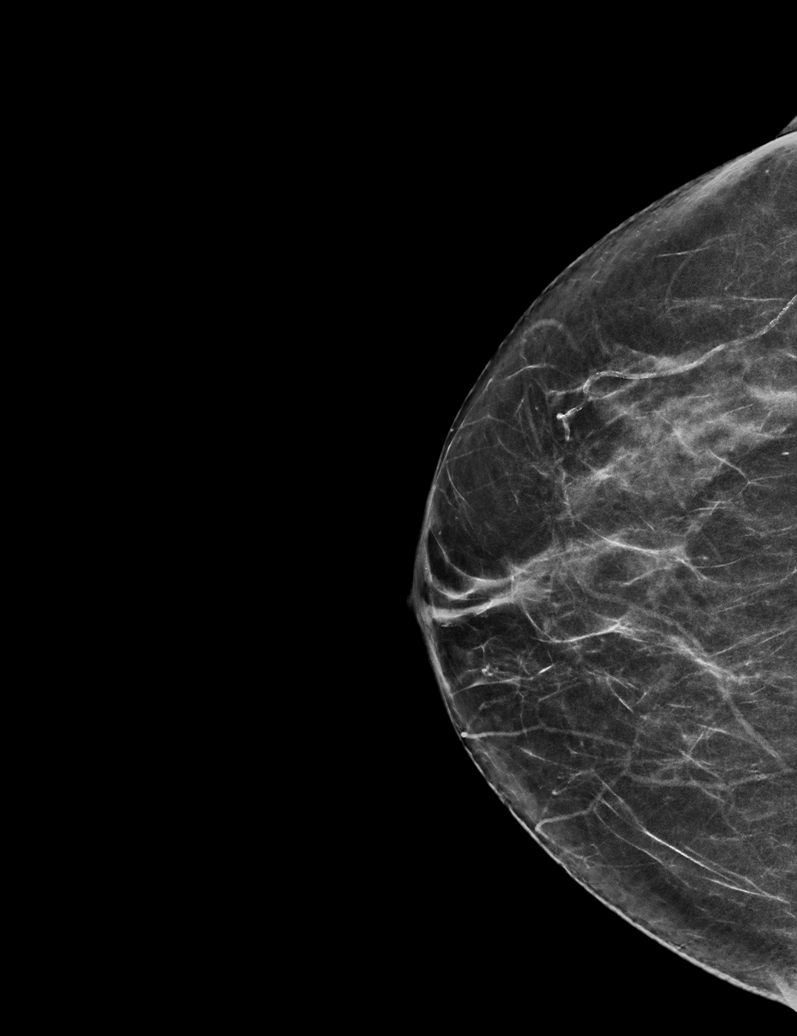

[6 of 36 positions shown; findings below may reference images not displayed]

ACR Breast Density Category c: The breast tissue is heterogeneously
dense, which may obscure small masses.
FINDINGS: There are no findings suspicious for malignancy.
IMPRESSION: No mammographic evidence of malignancy. A result letter of this
screening mammogram will be mailed directly to the patient.

RECOMMENDATION:
Screening mammogram in one year. (Code:Q3-W-BC3)

BI-RADS CATEGORY  1: Negative.

## 2023-06-28 ENCOUNTER — Encounter: Payer: Self-pay | Admitting: Family Medicine

## 2023-06-28 ENCOUNTER — Other Ambulatory Visit: Payer: Self-pay | Admitting: Family Medicine

## 2023-06-28 DIAGNOSIS — Z1231 Encounter for screening mammogram for malignant neoplasm of breast: Secondary | ICD-10-CM

## 2023-08-03 ENCOUNTER — Ambulatory Visit
Admission: RE | Admit: 2023-08-03 | Discharge: 2023-08-03 | Disposition: A | Discharge status: Home / Self Care | Source: Ambulatory Visit | Attending: Family Medicine | Admitting: Family Medicine

## 2023-08-03 DIAGNOSIS — Z1231 Encounter for screening mammogram for malignant neoplasm of breast: Secondary | ICD-10-CM | POA: Insufficient documentation
